# Patient Record
Sex: Male | Born: 1970 | Race: White | Hispanic: No | Marital: Married | Smoking: Never smoker
Health system: Southern US, Community
[De-identification: ages and names within clinical notes are randomized; demographics above are authoritative.]

## PROBLEM LIST (undated history)

## (undated) DIAGNOSIS — N4400 Torsion of testis, unspecified: Secondary | ICD-10-CM

## (undated) DIAGNOSIS — K219 Gastro-esophageal reflux disease without esophagitis: Secondary | ICD-10-CM

## (undated) DIAGNOSIS — K429 Umbilical hernia without obstruction or gangrene: Secondary | ICD-10-CM

## (undated) HISTORY — PX: ORCHIECTOMY: SHX2116

## (undated) HISTORY — DX: Gastro-esophageal reflux disease without esophagitis: K21.9

---

## 2011-05-31 HISTORY — PX: COLONOSCOPY: SHX174

## 2011-05-31 HISTORY — PX: ESOPHAGOGASTRODUODENOSCOPY: SHX1529

## 2012-07-07 ENCOUNTER — Emergency Department (HOSPITAL_BASED_OUTPATIENT_CLINIC_OR_DEPARTMENT_OTHER)
Admission: EM | Admit: 2012-07-07 | Discharge: 2012-07-07 | Disposition: A | Discharge status: Home / Self Care | Payer: BC Managed Care – PPO | Attending: Emergency Medicine | Admitting: Emergency Medicine

## 2012-07-07 ENCOUNTER — Emergency Department (HOSPITAL_BASED_OUTPATIENT_CLINIC_OR_DEPARTMENT_OTHER): Payer: BC Managed Care – PPO

## 2012-07-07 ENCOUNTER — Encounter (HOSPITAL_BASED_OUTPATIENT_CLINIC_OR_DEPARTMENT_OTHER): Payer: Self-pay | Admitting: *Deleted

## 2012-07-07 DIAGNOSIS — N50812 Left testicular pain: Secondary | ICD-10-CM

## 2012-07-07 DIAGNOSIS — N509 Disorder of male genital organs, unspecified: Secondary | ICD-10-CM | POA: Insufficient documentation

## 2012-07-07 HISTORY — DX: Torsion of testis, unspecified: N44.00

## 2012-07-07 NOTE — ED Notes (Signed)
Pt c/o left testicle pain with hx of torsion.

## 2012-07-08 NOTE — ED Provider Notes (Signed)
History     CSN: [id number]  Arrival date & time 07/07/12  1954   First MD Initiated Contact with Patient 07/07/12 2101      Chief Complaint  Patient presents with  . 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  Patient had spontaneous resolution while at triage with no pain medications but though he should still be checked out.  Patient is currently pain free.  He denies any urinary symptoms though he does have a history of kidney stones and he reports that this was completely different.  Patient also denies abdominal pain, nausea, vomiting, constipation, diarrhea, fever, or other concerning symptoms. Past Medical History  Diagnosis Date  . Testicular torsion     Past Surgical History  Procedure Date  . Orchiectomy     History reviewed. No pertinent family history.  History  Substance Use Topics  . Smoking status: Never Smoker   . Smokeless tobacco: Not on file  . Alcohol Use: No      Review of Systems  Constitutional: Negative.   HENT: Negative.   Eyes: Negative.   Respiratory: Negative.   Cardiovascular: Negative.   Gastrointestinal: Negative.   Genitourinary: Positive for testicular pain.  Musculoskeletal: Negative.   Skin: Negative.   Neurological: Negative.   Hematological: Negative.   Psychiatric/Behavioral: Negative.   All other systems reviewed and are negative.    Allergies  Review of patient's allergies indicates no known allergies.  Home Medications   Current Outpatient Rx  Name Route Sig Dispense Refill  . ACETAMINOPHEN 500 MG PO TABS Oral Take 1,000 mg by mouth every 6 (six) hours as needed. For pain.    Marland Kitchen ESOMEPRAZOLE MAGNESIUM  20 MG PO CPDR Oral Take 20 mg by mouth daily before breakfast.      BP 141/90  Pulse 102  Temp 98.6 F (37 C) (Oral)  Resp 16  Ht 6' (1.829 m)  Wt 215 lb (97.523 kg)  BMI 29.16 kg/m2  SpO2 100%  Physical Exam  Nursing note and vitals reviewed. GEN: Well-developed, well-nourished male in no distress HEENT: Atraumatic, normocephalic.  EYES: PERRLA BL, no scleral icterus. NECK: Trachea midline, no meningismus CV: regular rate and rhythm.  PULM: No respiratory distress.   GI: soft, non-tender. No guarding, rebound, or tenderness. + bowel sounds  GU: circumcised male with no inguinal lymphadenopathy, slight left testicular TTP, no masses noted, no other abnormalities noted Neuro: cranial nerves grossly 2-12 intact, no abnormalities of strength or sensation, A and O x 3 MSK: Patient moves all 4 extremities symmetrically, no deformity, edema, or injury noted Skin: No rashes petechiae, purpura, or jaundice Psych: no abnormality of mood   ED Course  Procedures (including critical care time)  Labs Reviewed - No data to display US Scrotum  07/07/2012  *RADIOLOGY REPORT*  Clinical Data:  Left testicular pain, history of prior torsion status post orchiopexy  SCROTAL ULTRASOUND DOPPLER ULTRASOUND OF THE TESTICLES  Technique: Complete ultrasound examination of the testicles, epididymis, and other scrotal structures was performed.  Color and spectral Doppler ultrasound were also utilized to evaluate blood flow to the testicles.  Comparison:  None.  Findings:  Right testis:  Normal homogeneous echotexture.  Right testicle measures 3.5 x 2.2 x 2.5 cm.  Normal color Doppler flow with arterial and venous waveforms detected.  No focal mass or abnormality.  Left testis:  Normal homogeneous echotexture.  No focal abnormality.  Left testicle measures 4.3 x 2.0 x 2.8 cm.  Normal color Doppler flow and arterial and venous waveforms detected.  Right epididymis:  Normal in size and appearance.  Left epididymis:   Normal in size and appearance.  Hydrocele:  Absent  Varicocele:   absent  Pulsed Doppler interrogation of both testes demonstrates low resistance flow bilaterally.  IMPRESSION: No acute finding by scrotal ultrasound.  Negative exam.  Original Report Authenticated By: Judie Petit. Ruel Favors, M.D.   [place] Art/ven Flow Abd Pelv Doppler  07/07/2012  *RADIOLOGY REPORT*  Clinical Data:  Left testicular pain, history of prior torsion status post orchiopexy  SCROTAL ULTRASOUND DOPPLER ULTRASOUND OF THE TESTICLES  Technique: Complete ultrasound examination of the testicles, epididymis, and other scrotal structures was performed.  Color and spectral Doppler ultrasound were also utilized to evaluate blood flow to the testicles.  Comparison:  None.  Findings:  Right testis:  Normal homogeneous echotexture.  Right testicle measures 3.5 x 2.2 x 2.5 cm.  Normal color Doppler flow with arterial and venous waveforms detected.  No focal mass or abnormality.  Left testis:  Normal homogeneous echotexture.  No focal abnormality.  Left testicle measures 4.3 x 2.0 x 2.8 cm.  Normal color Doppler flow and arterial and venous waveforms detected.  Right epididymis:  Normal in size and appearance.  Left epididymis:  Normal in size and appearance.  Hydrocele:  Absent  Varicocele:   absent  Pulsed Doppler interrogation of both testes demonstrates low resistance flow bilaterally.  IMPRESSION: No acute finding by scrotal ultrasound.  Negative exam.  Original Report Authenticated By: Judie Petit. Ruel Favors, M.D.     1. Testicular pain, left       MDM  Patient had spontaneouse resolution of symptoms similar to prior episodes of torsion.  He was asymmptomatic aside from mild residual tenderness on my exam and [place] was unremarkable.  Patient denied urinary symptoms.  Patient is 9 years s/p orchiopexy and we discussed that it was reasonable to follow-up with a urologist as he has had 2 episodes since surgery like this.  Patient was discharged in good  condition.        Cyndra Numbers, MD 07/08/12 1425

## 2013-12-01 ENCOUNTER — Encounter (HOSPITAL_BASED_OUTPATIENT_CLINIC_OR_DEPARTMENT_OTHER): Payer: Self-pay | Admitting: Emergency Medicine

## 2013-12-01 ENCOUNTER — Emergency Department (HOSPITAL_BASED_OUTPATIENT_CLINIC_OR_DEPARTMENT_OTHER)
Admission: EM | Admit: 2013-12-01 | Discharge: 2013-12-01 | Disposition: A | Discharge status: Home / Self Care | Payer: BC Managed Care – PPO | Attending: Emergency Medicine | Admitting: Emergency Medicine

## 2013-12-01 ENCOUNTER — Emergency Department (HOSPITAL_BASED_OUTPATIENT_CLINIC_OR_DEPARTMENT_OTHER): Payer: BC Managed Care – PPO

## 2013-12-01 DIAGNOSIS — R10A1 Flank pain, right side: Secondary | ICD-10-CM

## 2013-12-01 DIAGNOSIS — N201 Calculus of ureter: Secondary | ICD-10-CM

## 2013-12-01 DIAGNOSIS — Z79899 Other long term (current) drug therapy: Secondary | ICD-10-CM | POA: Insufficient documentation

## 2013-12-01 DIAGNOSIS — R109 Unspecified abdominal pain: Secondary | ICD-10-CM

## 2013-12-01 LAB — URINALYSIS, ROUTINE W REFLEX MICROSCOPIC
Glucose, UA: NEGATIVE mg/dL
Specific Gravity, Urine: 1.029 (ref 1.005–1.030)
Urobilinogen, UA: 0.2 mg/dL (ref 0.0–1.0)
pH: 5 (ref 5.0–8.0)

## 2013-12-01 LAB — CBC WITH DIFFERENTIAL/PLATELET
Basophils Absolute: 0 10*3/uL (ref 0.0–0.1)
Basophils Relative: 1 % (ref 0–1)
Eosinophils Relative: 3 % (ref 0–5)
HCT: 45.8 % (ref 39.0–52.0)
MCHC: 33.4 g/dL (ref 30.0–36.0)
Monocytes Absolute: 0.5 10*3/uL (ref 0.1–1.0)
Neutro Abs: 4 10*3/uL (ref 1.7–7.7)
Platelets: 213 10*3/uL (ref 150–400)
RDW: 12.9 % (ref 11.5–15.5)

## 2013-12-01 LAB — URINE MICROSCOPIC-ADD ON

## 2013-12-01 LAB — BASIC METABOLIC PANEL
Calcium: 9.3 mg/dL (ref 8.4–10.5)
Creatinine, Ser: 1.1 mg/dL (ref 0.50–1.35)
GFR calc Af Amer: >90 mL/min (ref >90)

## 2013-12-01 MED ORDER — KETOROLAC TROMETHAMINE 30 MG/ML IJ SOLN
30.0000 mg | Freq: Once | INTRAMUSCULAR | Status: AC
  Administered 2013-12-01: 30 mg via INTRAVENOUS
  Filled 2013-12-01: qty 1

## 2013-12-01 MED ORDER — ONDANSETRON HCL 4 MG/2ML IJ SOLN
4.0000 mg | Freq: Once | INTRAMUSCULAR | Status: AC
  Administered 2013-12-01: 4 mg via INTRAVENOUS
  Filled 2013-12-01: qty 2

## 2013-12-01 MED ORDER — SODIUM CHLORIDE 0.9 % IV BOLUS (SEPSIS)
1000.0000 mL | Freq: Once | INTRAVENOUS | Status: AC
  Administered 2013-12-01: 1000 mL via INTRAVENOUS

## 2013-12-01 MED ORDER — HYDROMORPHONE HCL PF 1 MG/ML IJ SOLN
1.0000 mg | Freq: Once | INTRAMUSCULAR | Status: AC
  Administered 2013-12-01: 1 mg via INTRAVENOUS
  Filled 2013-12-01: qty 1

## 2013-12-01 MED ORDER — TAMSULOSIN HCL 0.4 MG PO CAPS: 0.4000 mg | ORAL_CAPSULE | Freq: Every day | ORAL | Status: DC

## 2013-12-01 MED ORDER — HYDROCODONE-ACETAMINOPHEN 5-325 MG PO TABS: 1.0000 | ORAL_TABLET | Freq: Four times a day (QID) | ORAL | Status: DC | PRN

## 2013-12-01 NOTE — ED Notes (Signed)
Pt states he did have a near syncopal episode r/t to intense pain-wife states "he's real good at passing out"

## 2013-12-01 NOTE — ED Notes (Signed)
MD at bedside. 

## 2013-12-01 NOTE — ED Notes (Signed)
Pt c/o flank pain x 2 days hx kidney stones

## 2013-12-01 NOTE — ED Provider Notes (Addendum)
CSN: [id number]     Arrival date & time 12/01/13  1328 History   First MD Initiated Contact with Patient 12/01/13 1428     Chief Complaint  Patient presents with  . Flank Pain   (Consider location/radiation/quality/duration/timing/severity/associated sxs/prior Treatment) Patient is a 42 y.o. male presenting with flank pain. The history is provided by the patient and the spouse. No language interpreter was used.  Flank Pain This is a recurrent problem. The current episode started 2 days ago. Episode frequency: intermittently. The problem has been gradually worsening. Associated symptoms include abdominal pain. Pertinent negatives include no chest pain, no headaches and no shortness of breath. Nothing aggravates the symptoms. Nothing relieves the symptoms. He has tried nothing for the symptoms. The treatment provided no relief.    Past Medical History  Diagnosis Date  . Testicular torsion    Past Surgical History  Procedure Laterality Date  . Orchiectomy     History reviewed. No pertinent family history. History  Substance Use Topics  . Smoking status: Never Smoker   . Smokeless tobacco: Not on file  . Alcohol Use: No    Review of Systems  Constitutional: Negative for fever, activity change, appetite change and fatigue.  HENT: Negative for congestion, facial swelling, rhinorrhea and trouble swallowing.   Eyes: Negative for photophobia and pain.  Respiratory: Negative for cough, chest tightness and shortness of breath.   Cardiovascular: Negative for chest pain and leg swelling.  Gastrointestinal: Positive for abdominal pain. Negative for nausea, vomiting, diarrhea and constipation.  Endocrine: Negative for polydipsia and polyuria.  Genitourinary: Positive for flank pain. Negative for dysuria, urgency, decreased urine volume and difficulty urinating.  Musculoskeletal: Negative for back pain and gait problem.  Skin: Negative for color change, rash and wound.  Allergic/Immunologic:  Negative for immunocompromised state.  Neurological: Negative for dizziness, facial asymmetry, speech difficulty, weakness, numbness and headaches.  Psychiatric/Behavioral: Negative for confusion, decreased concentration and agitation.    Allergies  Review of patient's allergies indicates no known allergies.  Home Medications   Current Outpatient Rx  Name  Route  Sig  Dispense  Refill  . acetaminophen (TYLENOL) 500 MG tablet   Oral   Take 1,000 mg by mouth every 6 (six) hours as needed. For pain.         Marland Kitchen esomeprazole (NEXIUM) 20 MG capsule   Oral   Take 20 mg by mouth daily before breakfast.         . HYDROcodone-acetaminophen (NORCO) 5-325 MG per tablet   Oral   Take 1-2 tablets by mouth every 6 (six) hours as needed.   25 tablet   0   . tamsulosin (FLOMAX) 0.4 MG CAPS capsule   Oral   Take 1 capsule (0.4 mg total) by mouth daily.   30 capsule   0    BP 124/63  Pulse 61  Temp(Src) 97.7 F (36.5 C)  Resp 16  Ht 6' (1.829 m)  Wt 220 lb (99.791 kg)  BMI 29.83 kg/m2  SpO2 99% Physical Exam  Constitutional: He is oriented to person, place, and time. He appears well-developed and well-nourished. No distress.  HENT:  Head: Normocephalic and atraumatic.  Mouth/Throat: No oropharyngeal exudate.  Eyes: Pupils are equal, round, and reactive to light.  Neck: Normal range of motion. Neck supple.  Cardiovascular: Normal rate, regular rhythm and normal heart sounds.  Exam reveals no gallop and no friction rub.   No murmur heard. Pulmonary/Chest: Effort normal and breath sounds normal. No respiratory distress.  He has no wheezes. He has no rales.  Abdominal: Soft. Bowel sounds are normal. He exhibits no distension and no mass. There is no tenderness. There is no rebound and no guarding.    Musculoskeletal: Normal range of motion. He exhibits no edema and no tenderness.  Neurological: He is alert and oriented to person, place, and time.  Skin: Skin is warm and dry.    Psychiatric: He has a normal mood and affect.    ED Course  Procedures (including critical care time) Labs Review Labs Reviewed  URINALYSIS, ROUTINE W REFLEX MICROSCOPIC - Abnormal; Notable for the following:    Color, Urine BROWN (*)    APPearance CLOUDY (*)    Hgb urine dipstick LARGE (*)    Bilirubin Urine SMALL (*)    Ketones, ur 15 (*)    Protein, ur 100 (*)    Leukocytes, UA SMALL (*)    All other components within normal limits  URINE MICROSCOPIC-ADD ON - Abnormal; Notable for the following:    Squamous Epithelial / LPF FEW (*)    Bacteria, UA MANY (*)    All other components within normal limits  BASIC METABOLIC PANEL - Abnormal; Notable for the following:    Glucose, Bld 128 (*)    GFR calc non Af Amer 82 (*)    All other components within normal limits  CBC WITH DIFFERENTIAL   Imaging Review Ct Abdomen Pelvis Wo Contrast  12/01/2013   CLINICAL DATA:  Right flank pain.  Hematuria.  EXAM: CT ABDOMEN AND PELVIS WITHOUT CONTRAST  TECHNIQUE: Multidetector CT imaging of the abdomen and pelvis was performed following the standard protocol without intravenous contrast.  COMPARISON:  None.  FINDINGS: Mild right hydronephrosis and ureterectasis is seen. A 2 mm calculus is seen in the mid pelvic portion of the right ureter. No other ureteral or renal calculi identified. No evidence of left-sided hydronephrosis.  Mild hepatic steatosis noted. Liver, gallbladder, pancreas, spleen, and adrenal glands are otherwise unremarkable in appearance on this noncontrast study. No soft tissue masses or lymphadenopathy identified. Normal appendix visualized. No evidence of inflammatory process or abnormal fluid collections. No evidence of dilated bowel loops.  IMPRESSION: Mild right hydronephrosis due to 2 mm calculus in the mid pelvic portion of the right ureter.   Electronically Signed   By: Myles Rosenthal M.D.   On: 12/01/2013 15:37    EKG Interpretation    Date/Time:  Wednesday December 01 2013  15:16:32 EST Ventricular Rate:  62 PR Interval:  160 QRS Duration: 86 QT Interval:  430 QTC Calculation: 436 R Axis:   75 Text Interpretation:  Normal sinus rhythm with sinus arrhythmia Normal ECG Confirmed by DOCHERTY  MD, MEGAN ([id number]) on 12/01/2013 4:26:17 PM            MDM   1. Right ureteral stone   2. Right flank pain    Pt is a 42 y.o. male with Pmhx as above who presents with 2 days of intermittent, worsening R flank pain with assoc, nausea and an episode of syncope when pain suddenly worsened today.  No CP, SOB, fever, d/a, palpaitions.  On PE, VSS, pt uncomfortable, but in NAD. Abdominal exam benign. Urine with likely reactive leukocytosis, but does not appear infected.  Cr nml. CT stone study w/ 2mm stone in R ureter w/ mild hydronephrosis.  Pt's pain much improved after toradol, and dilaudid x2.  Will d/c home w/ norco, flomax.  Have recommended outpt urology f/u, PO hydration. Return  precautions given for new or worsening symptoms including fever, uncontrolled pain or vomiting.  I believe syncope vasovagal due to pain. Doubt cardiogenic syncope. Pt HDS here, EKG unremarkable.     Shanna Cisco, MD 12/01/13 0981  Shanna Cisco, MD 12/01/13 [phone]

## 2013-12-01 NOTE — Discharge Instructions (Signed)
Diet for Kidney Stones °Kidney stones are small, hard masses that form inside your kidneys. They are made up of salts and minerals and often form when high levels build up in the urine. The minerals can then start to build up, crystalize, and stick together to form stones. There are several different types of kidney stones. The following types of stones may be influenced by dietary factors:  °· Calcium Oxalate Stones. An oxalate is a salt found in certain foods. Within the body, calcium can combine with oxalates to form calcium oxalate stones, which can be excreted in the urine in high amounts. This is the most common type of kidney stone. °· Calcium Phosphate Stones. These stones may occur when the pH of the urine becomes too high, or less acidic, from too much calcium being excreted in the urine. The pH is a measure of how acidic or basic a substance is. °· Uric Acid Stones. This type of stone occurs when the pH of the urine becomes too low, or very acidic, because substances called purines build up in the urine. Purines are found in animal proteins. When the urine is highly concentrated with acid, uric acid kidney stones can form.   °Other risk factors for kidney stones include genetics, environment, and being overweight. Your caregiver may ask you to follow specific diet guidelines based on the type of stone you have to lessen the chances of your body making more kidney stones.  °GENERAL GUIDELINES FOR ALL TYPES OF STONES °· Drink plenty of fluid. Drink 12 16 cups of fluid a day, drinking mainly water. This is the most important thing you can do to prevent the formation of future kidney stones. °· Maintain a healthy weight. Your caregiver or dietitian can help you determine what a healthy weight is for you. If you are overweight, weight loss may help prevent the formation of future kidney stones. °· Eat a diet adequate in animal protein. Too much animal protein can contribute to the formation of stones. Your  dietitian can help you determine how much protein you should be eating. Avoid low carbohydrate, high protein diets. °· Follow a balanced eating approach. The DASH diet, which stands for "Dietary Approaches to Stop Hypertension," is an effective meal plan for reducing stone formation. This diet is high in fruits, vegetables, dairy, and whole grains and low in animal protein. Ask your caregiver or dietitian for information about the DASH diet. °ADDITIONAL DIET GUIDELINES FOR CALCIUM STONES °Avoid foods high in salt. This includes table salt, salt seasonings, MSG, soy sauce, cured and processed meats, salted crackers and snack foods, fast food, and canned soups and foods. Ask your caregiver or dietitian for information about reducing sodium in your diet or following the low sodium diet.  °Ensure adequate calcium intake. Use the following table for calcium guidelines: °· Men 65 years old and younger  1000 mg/day. °· Men 65 years old and older  1500 mg/day. °· Women 25 41 years old  1000 mg/day. °· Women 50 years and older  1500 mg/day. °Your dietitian can help you determine if you are getting enough calcium in your diet. Foods that are high in calcium include dairy products, broccoli, cheese, yogurt, and pudding. If you need to take a calcium supplement, take it only in the form of calcium citrate.  °Avoid foods high in oxalate. Be sure that any supplements you take do not contain more than 500 mg of vitamin C. Vitamin C is converted into oxalate in the body. You do   not need to avoid fruits and vegetables high in vitamin C.  °· Grains: High-fiber or bran cereal, whole-wheat bread, grits, barley, buckwheat, amaranth, pretzels, and fruitcake. °· Vegetables: Dried beans, wax beans, dark leafy greens, eggplant, leeks, okra, parsley, rutabaga, tomato paste, watercress, zucchini, and escarole. °· Fruit: Dried apricots, red currants, figs, kiwi, and rhubarb. °· Meat and Meat Substitutes: Soybeans and foods made from soy  (soyburger, miso), dried beans, peanut butter. °· Milk: Chocolate milk mixes and soymilk. °· Fats and Oils: Nuts (peanuts, almonds, pecans, cashews, hazelnuts) and nut butters, sesame seeds, and tDahini paste. °· Condiments/Miscellaneous: Chocolate, carob, marmalade, poppy seeds, instant iced tea, and juice from high-oxalate fruits.    °Document Released: 03/15/2011 Document Revised: 05/19/2012 Document Reviewed: 05/04/2012 °[employer]® Patient Information ©2014 [employer]. °Kidney Stones °Kidney stones (urolithiasis) are deposits that form inside your kidneys. The intense pain is caused by the stone moving through the urinary tract. When the stone moves, the ureter goes into spasm around the stone. The stone is usually passed in the urine.  °CAUSES  °· A disorder that makes certain neck glands produce too much parathyroid hormone (primary hyperparathyroidism). °· A buildup of uric acid crystals, similar to gout in your joints. °· Narrowing (stricture) of the ureter. °· A kidney obstruction present at birth (congenital obstruction). °· Previous surgery on the kidney or ureters. °· Numerous kidney infections. °SYMPTOMS  °· Feeling sick to your stomach (nauseous). °· Throwing up (vomiting). °· Blood in the urine (hematuria). °· Pain that usually spreads (radiates) to the groin. °· Frequency or urgency of urination. °DIAGNOSIS  °· Taking a history and physical exam. °· Blood or urine tests. °· CT scan. °· Occasionally, an examination of the inside of the urinary bladder (cystoscopy) is performed. °TREATMENT  °· Observation. °· Increasing your fluid intake. °· Extracorporeal shock wave lithotripsy This is a noninvasive procedure that uses shock waves to break up kidney stones. °· Surgery may be needed if you have severe pain or persistent obstruction. There are various surgical procedures. Most of the procedures are performed with the use of small instruments. Only small incisions are needed to accommodate these  instruments, so recovery time is minimized. °The size, location, and chemical composition are all important variables that will determine the proper choice of action for you. Talk to your health care provider to better understand your situation so that you will minimize the risk of injury to yourself and your kidney.  °HOME CARE INSTRUCTIONS  °· Drink enough water and fluids to keep your urine clear or pale yellow. This will help you to pass the stone or stone fragments. °· Strain all urine through the provided strainer. Keep all particulate matter and stones for your health care provider to see. The stone causing the pain may be as small as a grain of salt. It is very important to use the strainer each and every time you pass your urine. The collection of your stone will allow your health care provider to analyze it and verify that a stone has actually passed. The stone analysis will often identify what you can do to reduce the incidence of recurrences. °· Only take over-the-counter or prescription medicines for pain, discomfort, or fever as directed by your health care provider. °· Make a follow-up appointment with your health care provider as directed. °· Get follow-up X-rays if required. The absence of pain does not always mean that the stone has passed. It may have only stopped moving. If the urine remains completely obstructed, it can   cause loss of kidney function or even complete destruction of the kidney. It is your responsibility to make sure X-rays and follow-ups are completed. Ultrasounds of the kidney can show blockages and the status of the kidney. Ultrasounds are not associated with any radiation and can be performed easily in a matter of minutes. °SEEK MEDICAL CARE IF: °· You experience pain that is progressive and unresponsive to any pain medicine you have been prescribed. °SEEK IMMEDIATE MEDICAL CARE IF:  °· Pain cannot be controlled with the prescribed medicine. °· You have a fever or shaking  chills. °· The severity or intensity of pain increases over 18 hours and is not relieved by pain medicine. °· You develop a new onset of abdominal pain. °· You feel faint or pass out. °· You are unable to urinate. °MAKE SURE YOU:  °· Understand these instructions. °· Will watch your condition. °· Will get help right away if you are not doing well or get worse. °Document Released: 11/18/2005 Document Revised: 07/21/2013 Document Reviewed: 04/21/2013 °[employer]® Patient Information ©2014 [employer]. ° ° °

## 2016-10-07 ENCOUNTER — Emergency Department (HOSPITAL_BASED_OUTPATIENT_CLINIC_OR_DEPARTMENT_OTHER)
Admission: EM | Admit: 2016-10-07 | Discharge: 2016-10-07 | Disposition: A | Discharge status: Home / Self Care | Payer: BLUE CROSS/BLUE SHIELD | Attending: Emergency Medicine | Admitting: Emergency Medicine

## 2016-10-07 ENCOUNTER — Emergency Department (HOSPITAL_BASED_OUTPATIENT_CLINIC_OR_DEPARTMENT_OTHER): Payer: BLUE CROSS/BLUE SHIELD

## 2016-10-07 ENCOUNTER — Encounter (HOSPITAL_BASED_OUTPATIENT_CLINIC_OR_DEPARTMENT_OTHER): Payer: Self-pay | Admitting: *Deleted

## 2016-10-07 DIAGNOSIS — N201 Calculus of ureter: Secondary | ICD-10-CM | POA: Insufficient documentation

## 2016-10-07 DIAGNOSIS — R109 Unspecified abdominal pain: Secondary | ICD-10-CM | POA: Diagnosis present

## 2016-10-07 HISTORY — DX: Umbilical hernia without obstruction or gangrene: K42.9

## 2016-10-07 LAB — BASIC METABOLIC PANEL
ANION GAP: 11 (ref 5–15)
BUN: 11 mg/dL (ref 6–20)
CALCIUM: 9.7 mg/dL (ref 8.9–10.3)
CO2: 23 mmol/L (ref 22–32)
Chloride: 107 mmol/L (ref 101–111)
Creatinine, Ser: 1.08 mg/dL (ref 0.61–1.24)
GFR calc Af Amer: >60 mL/min (ref >60)
Glucose, Bld: 91 mg/dL (ref 65–99)
POTASSIUM: 3.8 mmol/L (ref 3.5–5.1)
SODIUM: 141 mmol/L (ref 135–145)

## 2016-10-07 LAB — URINE MICROSCOPIC-ADD ON: BACTERIA UA: NONE SEEN

## 2016-10-07 LAB — URINALYSIS, ROUTINE W REFLEX MICROSCOPIC
Bilirubin Urine: NEGATIVE
Glucose, UA: NEGATIVE mg/dL
KETONES UR: 15 mg/dL — AB
LEUKOCYTES UA: NEGATIVE
Nitrite: NEGATIVE
PROTEIN: NEGATIVE mg/dL
Specific Gravity, Urine: 1.027 (ref 1.005–1.030)
pH: 5.5 (ref 5.0–8.0)

## 2016-10-07 MED ORDER — HYDROMORPHONE HCL 1 MG/ML IJ SOLN
1.0000 mg | Freq: Once | INTRAMUSCULAR | Status: AC
  Administered 2016-10-07: 1 mg via INTRAVENOUS
  Filled 2016-10-07: qty 1

## 2016-10-07 MED ORDER — FENTANYL CITRATE (PF) 100 MCG/2ML IJ SOLN
50.0000 ug | Freq: Once | INTRAMUSCULAR | Status: AC
  Administered 2016-10-07: 50 ug via INTRAVENOUS
  Filled 2016-10-07: qty 2

## 2016-10-07 MED ORDER — SODIUM CHLORIDE 0.9 % IV SOLN
Freq: Once | INTRAVENOUS | Status: AC
  Administered 2016-10-07: 04:00:00 via INTRAVENOUS

## 2016-10-07 MED ORDER — KETOROLAC TROMETHAMINE 15 MG/ML IJ SOLN
15.0000 mg | Freq: Once | INTRAMUSCULAR | Status: AC
  Administered 2016-10-07: 15 mg via INTRAVENOUS
  Filled 2016-10-07: qty 1

## 2016-10-07 MED ORDER — ONDANSETRON 8 MG PO TBDP: 8.0000 mg | ORAL_TABLET | Freq: Three times a day (TID) | ORAL | 0 refills | Status: DC | PRN

## 2016-10-07 MED ORDER — HYDROMORPHONE HCL 4 MG PO TABS: 4.0000 mg | ORAL_TABLET | ORAL | 0 refills | Status: DC | PRN

## 2016-10-07 MED ORDER — NAPROXEN 375 MG PO TABS: ORAL_TABLET | ORAL | 0 refills | Status: DC

## 2016-10-07 MED ORDER — ONDANSETRON HCL 4 MG/2ML IJ SOLN
4.0000 mg | Freq: Once | INTRAMUSCULAR | Status: AC
  Administered 2016-10-07: 4 mg via INTRAVENOUS
  Filled 2016-10-07: qty 2

## 2016-10-07 NOTE — ED Notes (Signed)
Dr. Molpus into room 

## 2016-10-07 NOTE — ED Provider Notes (Signed)
[facility] Provider Note: Lowella DellJ. Lane Margrete Delude, MD, FACEP  CSN: [id number] MRN: [id number] ARRIVAL: 10/07/16 at 0329 ROOM: [facility]04/[facility]04   CHIEF COMPLAINT  Flank Pain   HISTORY OF PRESENT ILLNESS  Philip Myers is a 45 y.o. male with a history of ureterolithiasis. He is here with left flank pain that began yesterday. His episode yesterday lasted about 3 hours and is described as severe and characterized as like previous kidney stone pain. The pain resolved but recurred this morning. The pain radiates to the left lower quadrant of the abdomen. It is not worse with movement or palpation. He has had some mild nausea but no vomiting. His urine did appear cloudy this morning.   Past Medical History:  Diagnosis Date  . Testicular torsion   . Umbilical hernia     Past Surgical History:  Procedure Laterality Date  . ORCHIECTOMY      History reviewed. No pertinent family history.  Social History  Substance Use Topics  . Smoking status: Never Smoker  . Smokeless tobacco: Never Used  . Alcohol use No    Prior to Admission medications   Medication Sig Start Date End Date Taking? Authorizing Provider  acetaminophen (TYLENOL) 500 MG tablet Take 1,000 mg by mouth every 6 (six) hours as needed. For pain.    Historical Provider, MD  esomeprazole (NEXIUM) 20 MG capsule Take 20 mg by mouth daily before breakfast.    Historical Provider, MD  HYDROcodone-acetaminophen (NORCO) 5-325 MG per tablet Take 1-2 tablets by mouth every 6 (six) hours as needed. 12/01/13   Toy CookeyMegan Docherty, MD  tamsulosin (FLOMAX) 0.4 MG CAPS capsule Take 1 capsule (0.4 mg total) by mouth daily. 12/01/13   Toy CookeyMegan Docherty, MD    Allergies Patient has no known allergies.   REVIEW OF SYSTEMS  Negative except as noted here or in the History of Present Illness.   PHYSICAL EXAMINATION  Initial Vital Signs Blood pressure 148/92, pulse 74, temperature 97.7 F (36.5 C), temperature source Oral, resp. rate 24, height 6'  (1.829 m), weight 222 lb (100.7 kg), SpO2 100 %.  Examination General: Well-developed, well-nourished male in no acute distress; appearance consistent with age of record HENT: normocephalic; atraumatic Eyes: pupils equal, round and reactive to light; extraocular muscles intact Neck: supple Heart: regular rate and rhythm Lungs: clear to auscultation bilaterally Abdomen: soft; nondistended; nontender; bowel sounds present GU: No CVA tenderness Extremities: No deformity; full range of motion; pulses normal Neurologic: Awake, alert and oriented; motor function intact in all extremities and symmetric; no facial droop Skin: Warm and dry Psychiatric: Appears uncomfortable   RESULTS  Summary of this visit's results, reviewed by myself:   EKG Interpretation  Date/Time:    Ventricular Rate:    PR Interval:    QRS Duration:   QT Interval:    QTC Calculation:   R Axis:     Text Interpretation:        Laboratory Studies: Results for orders placed or performed during the hospital encounter of 10/07/16 (from the past 24 hour(s))  Basic metabolic panel     Status: None   Collection Time: 10/07/16  3:55 AM  Result Value Ref Range   Sodium 141 135 - 145 mmol/L   Potassium 3.8 3.5 - 5.1 mmol/L   Chloride 107 101 - 111 mmol/L   CO2 23 22 - 32 mmol/L   Glucose, Bld 91 65 - 99 mg/dL   BUN 11 6 - 20 mg/dL   Creatinine, Ser 7.821.08 0.61 -  1.24 mg/dL   Calcium 9.7 8.9 - 16.110.3 mg/dL   GFR calc non Af Amer >60 >60 mL/min   GFR calc Af Amer >60 >60 mL/min   Anion gap 11 5 - 15  Urinalysis, Routine w reflex microscopic (not [facility])     Status: Abnormal   Collection Time: 10/07/16  5:35 AM  Result Value Ref Range   Color, Urine AMBER (A) YELLOW   APPearance CLOUDY (A) CLEAR   Specific Gravity, Urine 1.027 1.005 - 1.030   pH 5.5 5.0 - 8.0   Glucose, UA NEGATIVE NEGATIVE mg/dL   Hgb urine dipstick LARGE (A) NEGATIVE   Bilirubin Urine NEGATIVE NEGATIVE   Ketones, ur 15 (A) NEGATIVE mg/dL    Protein, ur NEGATIVE NEGATIVE mg/dL   Nitrite NEGATIVE NEGATIVE   Leukocytes, UA NEGATIVE NEGATIVE  Urine microscopic-add on     Status: Abnormal   Collection Time: 10/07/16  5:35 AM  Result Value Ref Range   Squamous Epithelial / LPF 0-5 (A) NONE SEEN   WBC, UA 0-5 0 - 5 WBC/hpf   RBC / HPF TOO NUMEROUS TO COUNT 0 - 5 RBC/hpf   Bacteria, UA NONE SEEN NONE SEEN   Urine-Other MUCOUS PRESENT    Imaging Studies: Ct Renal Stone Study  Result Date: 10/07/2016 CLINICAL DATA:  Flank pain.  History of kidney stones. EXAM: CT ABDOMEN AND PELVIS WITHOUT CONTRAST TECHNIQUE: Multidetector CT imaging of the abdomen and pelvis was performed following the standard protocol without IV contrast. COMPARISON:  CT AP 12/01/2013 FINDINGS: Lower chest: No pulmonary nodules or pleural effusion. No visible pericardial effusion. Hepatobiliary: Normal noncontrast appearance of the liver. No visible biliary dilatation. Normal gallbladder. Pancreas: Normal noncontrast appearance of the pancreas. No peripancreatic fluid collection. Spleen: Normal. Adrenal glands: Normal. Urinary Tract: --Right kidney: No hydronephrosis or perinephric stranding. No nephrolithiasis. No obstructing ureteral stones. --Left kidney: Mild left hydronephrosis. There is mild dilatation of the left ureter. There is a 2 mm stone at the left ureterovesical junction (axial image 78). There is minimal left perinephric stranding. --Urinary bladder: As above, left ureterovesical junction 2 mm stone. Stomach/Bowel: No dilated loops of bowel. No evidence of colonic or enteric inflammation. No fluid collection within the abdomen. Vascular/Lymphatic: No abdominal aortic aneurysm or atherosclerotic calcification. No abdominal or pelvic lymphadenopathy. Reproductive: Normal prostate and seminal vesicles. Musculoskeletal. No focal osseous lesion. Normal visualized extraperitoneal and extrathoracic soft tissues. Fat- containing small umbilical hernia. IMPRESSION: 1.  Mild left hydronephrosis and hydroureter with 2 mm calculus at the left ureterovesical junction. 2. No other nephrolithiasis. Electronically Signed   By: Deatra RobinsonKevin  Herman M.D.   On: 10/07/2016 04:49    ED COURSE  Nursing notes and initial vitals signs, including pulse oximetry, reviewed.  Vitals:   10/07/16 0337 10/07/16 0339 10/07/16 0445 10/07/16 0500  BP: 148/92  134/73 122/78  Pulse: 74  67 70  Resp: 24     Temp: 97.7 F (36.5 C)     TempSrc: Oral     SpO2: 100%  98% 91%  Weight:  222 lb (100.7 kg)    Height:  6' (1.829 m)     6:02 AM Pain well controlled at this time. [place] [facility] with whom he can follow-up should the stone not pass spontaneously.  PROCEDURES    ED DIAGNOSES     ICD-9-CM ICD-10-CM   1. Ureterolithiasis 592.1 N20.1        Paula LibraJohn Kajol Crispen, MD 10/07/16 [phone]

## 2016-10-07 NOTE — ED Notes (Signed)
[facility] to CT, given ice chip.

## 2016-10-07 NOTE — ED Notes (Signed)
Pt aware of need for urine, IVF infusing, "not able at this time".

## 2016-10-07 NOTE — ED Notes (Signed)
[facility], pain increasing.

## 2016-10-07 NOTE — ED Notes (Signed)
C/o kidney stone pain, ongoing intermitantly for 3 weeks, h/o similar, h/o 5 kidney stones, last ~ 2 years ago, pt of [facility], pain fluctuates, onset of this episode yesterday, worse yesterday, abated and returned this am, reports "no obvious blood". seen by PCP, reportedly has an umbilical hernia. No meds PTA.

## 2016-10-07 NOTE — ED Notes (Signed)
Continues to writhe in pain, remains restless and moaning.

## 2017-03-12 ENCOUNTER — Encounter (INDEPENDENT_AMBULATORY_CARE_PROVIDER_SITE_OTHER): Payer: BLUE CROSS/BLUE SHIELD | Admitting: Ophthalmology

## 2017-03-12 DIAGNOSIS — H353131 Nonexudative age-related macular degeneration, bilateral, early dry stage: Secondary | ICD-10-CM

## 2017-03-12 DIAGNOSIS — H43813 Vitreous degeneration, bilateral: Secondary | ICD-10-CM | POA: Diagnosis not present

## 2017-03-12 DIAGNOSIS — H3562 Retinal hemorrhage, left eye: Secondary | ICD-10-CM

## 2018-03-12 ENCOUNTER — Ambulatory Visit (INDEPENDENT_AMBULATORY_CARE_PROVIDER_SITE_OTHER): Payer: BLUE CROSS/BLUE SHIELD | Admitting: Ophthalmology

## 2019-11-03 ENCOUNTER — Emergency Department (HOSPITAL_BASED_OUTPATIENT_CLINIC_OR_DEPARTMENT_OTHER)
Admission: EM | Admit: 2019-11-03 | Discharge: 2019-11-03 | Disposition: A | Discharge status: Home / Self Care | Payer: 59 | Attending: Emergency Medicine | Admitting: Emergency Medicine

## 2019-11-03 ENCOUNTER — Other Ambulatory Visit: Payer: Self-pay

## 2019-11-03 ENCOUNTER — Encounter (HOSPITAL_BASED_OUTPATIENT_CLINIC_OR_DEPARTMENT_OTHER): Payer: Self-pay

## 2019-11-03 ENCOUNTER — Emergency Department (HOSPITAL_BASED_OUTPATIENT_CLINIC_OR_DEPARTMENT_OTHER): Payer: 59

## 2019-11-03 DIAGNOSIS — Z79899 Other long term (current) drug therapy: Secondary | ICD-10-CM | POA: Diagnosis not present

## 2019-11-03 DIAGNOSIS — R109 Unspecified abdominal pain: Secondary | ICD-10-CM | POA: Diagnosis not present

## 2019-11-03 DIAGNOSIS — N4889 Other specified disorders of penis: Secondary | ICD-10-CM | POA: Diagnosis not present

## 2019-11-03 LAB — CBC WITH DIFFERENTIAL/PLATELET
Abs Immature Granulocytes: 0.01 10*3/uL (ref 0.00–0.07)
Basophils Absolute: 0.1 10*3/uL (ref 0.0–0.1)
Basophils Relative: 1 %
Eosinophils Absolute: 0.3 10*3/uL (ref 0.0–0.5)
Eosinophils Relative: 4 %
HCT: 47.4 % (ref 39.0–52.0)
Hemoglobin: 15.4 g/dL (ref 13.0–17.0)
Immature Granulocytes: 0 %
Lymphocytes Relative: 30 %
Lymphs Abs: 2 10*3/uL (ref 0.7–4.0)
MCH: 30.5 pg (ref 26.0–34.0)
MCHC: 32.5 g/dL (ref 30.0–36.0)
MCV: 93.9 fL (ref 80.0–100.0)
Monocytes Absolute: 0.7 10*3/uL (ref 0.1–1.0)
Monocytes Relative: 11 %
Neutro Abs: 3.5 10*3/uL (ref 1.7–7.7)
Neutrophils Relative %: 54 %
Platelets: 237 10*3/uL (ref 150–400)
RBC: 5.05 MIL/uL (ref 4.22–5.81)
RDW: 13.1 % (ref 11.5–15.5)
WBC: 6.5 10*3/uL (ref 4.0–10.5)
nRBC: 0 % (ref 0.0–0.2)

## 2019-11-03 LAB — COMPREHENSIVE METABOLIC PANEL
ALT: 36 U/L (ref 0–44)
AST: 21 U/L (ref 15–41)
Albumin: 4 g/dL (ref 3.5–5.0)
Alkaline Phosphatase: 79 U/L (ref 38–126)
Anion gap: 8 (ref 5–15)
BUN: 14 mg/dL (ref 6–20)
CO2: 23 mmol/L (ref 22–32)
Calcium: 9.1 mg/dL (ref 8.9–10.3)
Chloride: 107 mmol/L (ref 98–111)
Creatinine, Ser: 0.96 mg/dL (ref 0.61–1.24)
GFR calc Af Amer: >60 mL/min (ref >60)
GFR calc non Af Amer: >60 mL/min (ref >60)
Glucose, Bld: 109 mg/dL — ABNORMAL HIGH (ref 70–99)
Potassium: 4.1 mmol/L (ref 3.5–5.1)
Sodium: 138 mmol/L (ref 135–145)
Total Bilirubin: 0.6 mg/dL (ref 0.3–1.2)
Total Protein: 7.3 g/dL (ref 6.5–8.1)

## 2019-11-03 LAB — URINALYSIS, ROUTINE W REFLEX MICROSCOPIC
Bilirubin Urine: NEGATIVE
Glucose, UA: NEGATIVE mg/dL
Ketones, ur: NEGATIVE mg/dL
Leukocytes,Ua: NEGATIVE
Nitrite: NEGATIVE
Protein, ur: NEGATIVE mg/dL
Specific Gravity, Urine: >1.03 — ABNORMAL HIGH (ref 1.005–1.030)
pH: 5.5 (ref 5.0–8.0)

## 2019-11-03 LAB — URINALYSIS, MICROSCOPIC (REFLEX)

## 2019-11-03 LAB — LIPASE, BLOOD: Lipase: 22 U/L (ref 11–51)

## 2019-11-03 MED ORDER — SODIUM CHLORIDE 0.9 % IV BOLUS (SEPSIS)
1000.0000 mL | Freq: Once | INTRAVENOUS | Status: AC
  Administered 2019-11-03: 04:00:00 1000 mL via INTRAVENOUS

## 2019-11-03 NOTE — ED Triage Notes (Signed)
Right sided back pain w/ painful urination; onset several weeks ago worsening tonight. Pain now relieved. Hx of kidney stones.

## 2019-11-03 NOTE — ED Provider Notes (Signed)
TIME SEEN: 3:08 AM  CHIEF COMPLAINT: Right flank pain, pain at the tip of the penis  HPI: Patient is a 48 year old male with history of testicular torsion, umbilical hernia, kidney stones who presents to the emergency department with sudden onset sharp severe pain at the tip of his penis that would last for 15 to 30 seconds at a time and then resolve within sudden onset right flank pain for 2 hours tonight.  Flank pain came on after pain at the tip of his penis started.  Flank pain has significantly improved.  Still having pain at the tip of his penis intermittently.  No testicular pain or swelling.  No fevers, nausea, vomiting, diarrhea.  Denies dysuria, hematuria or penile discharge.  He is not concerned for STDs.  States that he does not think this feels like his previous kidney stones.  No history of injury to his back.  He does report he has had bilateral back pain for several months that he has not seen a doctor for.  States that pain would get worse after eating.  Denies abdominal pain.  ROS: See HPI Constitutional: no fever  Eyes: no drainage  ENT: no runny nose   Cardiovascular:  no chest pain  Resp: no SOB  GI: no vomiting GU: no dysuria Integumentary: no rash  Allergy: no hives  Musculoskeletal: no leg swelling  Neurological: no slurred speech ROS otherwise negative  PAST MEDICAL HISTORY/PAST SURGICAL HISTORY:  Past Medical History:  Diagnosis Date  . Testicular torsion   . Umbilical hernia     MEDICATIONS:  Prior to Admission medications   Medication Sig Start Date End Date Taking? Authorizing Provider  esomeprazole (NEXIUM) 20 MG capsule Take 20 mg by mouth daily before breakfast.    [provider]  HYDROmorphone (DILAUDID) 4 MG tablet Take 1 tablet (4 mg total) by mouth every 4 (four) hours as needed (for pain). 10/07/16   Molpus, Ceylon, MD  naproxen (NAPROSYN) 375 MG tablet Take one tablet twice daily until kidney stone passes. 10/07/16   Molpus, Manolito, MD   ondansetron (ZOFRAN ODT) 8 MG disintegrating tablet Take 1 tablet (8 mg total) by mouth every 8 (eight) hours as needed for nausea or vomiting. 10/07/16   Molpus, Endy, MD    ALLERGIES:  No Known Allergies  SOCIAL HISTORY:  Social History   Tobacco Use  . Smoking status: Never Smoker  . Smokeless tobacco: Never Used  Substance Use Topics  . Alcohol use: No    FAMILY HISTORY: History reviewed. No pertinent family history.  EXAM: BP (!) 163/97 (BP Location: Right Arm)   Pulse 84   Temp 97.9 F (36.6 C)   Resp 18   Ht 6' (1.829 m)   Wt 106.6 kg   SpO2 98%   BMI 31.87 kg/m  CONSTITUTIONAL: Alert and oriented and responds appropriately to questions. Well-appearing; well-nourished HEAD: Normocephalic EYES: Conjunctivae clear, pupils appear equal, EOM appear intact ENT: normal nose; moist mucous membranes NECK: Supple, normal ROM CARD: RRR; S1 and S2 appreciated; no murmurs, no clicks, no rubs, no gallops RESP: Normal chest excursion without splinting or tachypnea; breath sounds clear and equal bilaterally; no wheezes, no rhonchi, no rales, no hypoxia or respiratory distress, speaking full sentences ABD/GI: Normal bowel sounds; non-distended; soft, non-tender, no rebound, no guarding, no peritoneal signs, no hepatosplenomegaly GU:  Normal external genitalia, circumcised male, normal penile shaft, no blood or discharge at the urethral meatus, no testicular masses or tenderness on exam, no scrotal masses  or swelling, no hernias appreciated, 2+ femoral pulses bilaterally; no perineal erythema, warmth, subcutaneous air or crepitus; no high riding testicle, normal bilateral cremasteric reflex.  Chaperone present for exam. BACK:  The back appears normal, no CVA tenderness, no midline spinal tenderness or step-off or deformity, no redness or warmth, no ecchymosis or swelling, back is nontender to palpation EXT: Normal ROM in all joints; no deformity noted, no edema; no cyanosis SKIN:  Normal color for age and race; warm; no rash on exposed skin NEURO: Moves all extremities equally PSYCH: The patient's mood and manner are appropriate.   MEDICAL DECISION MAKING: Patient here with penile pain, right flank pain.  He states he thinks this is different than his kidney stones but symptoms sound classic for ureterolithiasis.  Suspect that he may have passed a stone but states he is still having some residual right flank pain currently.  He is requesting imaging today.  Unable to obtain ultrasound at this time and therefore will proceed with CT scan.  Will obtain labs, urinalysis.  His abdominal exam is currently benign.  Doubt cholecystitis, appendicitis, perforation, colitis, bowel obstruction.  His GU exam is benign.  No sign of torsion, epididymitis, testicular mass, Fournier's gangrene, scrotal cellulitis or abscess.  He states he is not concerned for sexually transmitted diseases.  Patient is married and has 1 male partner.  States pain is improved and declines any pain medication at this time.  No current nausea or vomiting.  Symptoms seem atypical for dissection.  Differential also includes UTI, pyelonephritis.  Urinalysis and urine culture pending.  ED PROGRESS: Patient's labs are unremarkable.  Urine shows small amount of blood but no other sign of infection.  Culture is pending.  His CT scan today was unremarkable.  No ureterolithiasis, hydronephrosis.  Normal-appearing appendix.  He states his pain is now completely gone.  He seems now more concerned about this bilateral back pain that he has for months that he has had worse after eating.  States he thinks it is ulcers but has never been diagnosed with ulcers.  No previous history of endoscopy.  Have recommended follow-up with his primary care physician for this.  We will also refer to gastroenterology.  Patient may benefit from endoscopy.  He is reassured by his normal work-up today.  At this time, I do not feel there is any  life-threatening condition present. I have reviewed, interpreted and discussed all results (EKG, imaging, lab, urine as appropriate) and exam findings with patient/family. I have reviewed nursing notes and appropriate previous records.  I feel the patient is safe to be discharged home without further emergent workup and can continue workup as an outpatient as needed. Discussed usual and customary return precautions. Patient/family verbalize understanding and are comfortable with this plan.  Outpatient follow-up has been provided as needed. All questions have been answered.   Philip SchaumannJohn Myers was evaluated in Emergency Department on 11/03/2019 for the symptoms described in the history of present illness. He was evaluated in the context of the global COVID-19 pandemic, which necessitated consideration that the patient might be at risk for infection with the SARS-CoV-2 virus that causes COVID-19. Institutional protocols and algorithms that pertain to the evaluation of patients at risk for COVID-19 are in a state of rapid change based on information released by regulatory bodies including the CDC and federal and state organizations. These policies and algorithms were followed during the patient's care in the ED.  Patient was seen wearing N95, face shield, gloves.  Jonas Goh, Delice Bison, DO 11/03/19 [phone]

## 2019-11-03 NOTE — ED Notes (Signed)
ED Provider at bedside. Ward MD. 

## 2019-11-03 NOTE — Discharge Instructions (Addendum)
I suspect that you have passed a kidney stone today.  Your CT scan, labs were normal.  There was a small amount of blood in your urine.  You may alternate between Tylenol 1000 mg every 6 hours as needed and ibuprofen 800 mg every 8 hours as needed at home for any residual pain.  As for the pain that you have had in your back for the past several months that is worse after eating, I recommend follow-up with your primary care physician for this.  Your primary care physician may refer you to gastroenterology for further evaluation.

## 2019-11-03 NOTE — ED Notes (Signed)
Patient transported to CT 

## 2019-11-04 ENCOUNTER — Encounter: Payer: Self-pay | Admitting: Gastroenterology

## 2019-11-04 LAB — URINE CULTURE: Culture: NO GROWTH

## 2019-12-07 ENCOUNTER — Telehealth (INDEPENDENT_AMBULATORY_CARE_PROVIDER_SITE_OTHER): Payer: 59 | Admitting: Gastroenterology

## 2019-12-07 ENCOUNTER — Other Ambulatory Visit: Payer: Self-pay

## 2019-12-07 ENCOUNTER — Encounter: Payer: Self-pay | Admitting: Gastroenterology

## 2019-12-07 VITALS — Ht 72.0 in | Wt 235.0 lb

## 2019-12-07 DIAGNOSIS — K2100 Gastro-esophageal reflux disease with esophagitis, without bleeding: Secondary | ICD-10-CM

## 2019-12-07 DIAGNOSIS — Z1212 Encounter for screening for malignant neoplasm of rectum: Secondary | ICD-10-CM

## 2019-12-07 DIAGNOSIS — R109 Unspecified abdominal pain: Secondary | ICD-10-CM | POA: Diagnosis not present

## 2019-12-07 DIAGNOSIS — K429 Umbilical hernia without obstruction or gangrene: Secondary | ICD-10-CM | POA: Diagnosis not present

## 2019-12-07 DIAGNOSIS — Z1211 Encounter for screening for malignant neoplasm of colon: Secondary | ICD-10-CM

## 2019-12-07 DIAGNOSIS — K760 Fatty (change of) liver, not elsewhere classified: Secondary | ICD-10-CM

## 2019-12-07 NOTE — Progress Notes (Signed)
Chief Complaint: Flank pain.  Referring Provider:  No ref. provider found      ASSESSMENT AND PLAN;   #1. GERD (EGD June 2012 - Gd A esophagitis, Dr. Norma Fredrickson)  #2.  Colorectal cancer screening due at age 49 (had colon June 2012- neg, Dr. Norma Fredrickson  #3.  Fatty liver with normal LFTs  #4.  Right flank pain attributed to renal stones. Neg NCCT 11/2019.  Has appointment with urology January 07, 2020  #5.  Umbilical hernia  Plan: -Continue Nexium 20mg  OTC on demand basis. -Nonpharmacologic means of reflux control was stressed. -Encouraged him to start exercising and gradually reduce weight.  Watch calorie intake.  This will certainly help with fatty liver. -He has appointment with urology. -Has asymptomatic umbilical hernia, leaning toward surgery.  When he decides to get surgery done, he can get in touch with .  We will be more than happy to refer him to surgery. -Recall colonoscopy at age 39.   HPI:    Philip Myers is a 49 y.o. male   No nausea, vomiting, heartburn, regurgitation, odynophagia or dysphagia.  No significant diarrhea or constipation.  No melena or hematochezia. No unintentional weight loss. No abdominal pain.  No history of itching, skin lesions, easy bruisability, intake of over-the-counter medications including diet pills, herbal medications, anabolic steroids or Tylenol.  There is no history of blood transfusions, IV drug use or family history of liver disease.  No jaundice dark urine or pale stools.  No history of alcohol abuse.  Had right flank pain-similar pain before attributed to kidney stones.  Seen in ED 11/02/2020  Past GI procedures: -Colonoscopy May 31, 2011 Dr. June 02, 2011 except for hemorrhoids.  Repeat at age 3. -EGD May 31, 2011 Dr. June 02, 2011 grade a esophagitis.  No hiatal hernia.  Past Medical History:  Diagnosis Date  . GERD (gastroesophageal reflux disease)   . Testicular torsion   . Umbilical hernia     Past Surgical History:    Procedure Laterality Date  . COLONOSCOPY  05/31/2011   [place]   . ESOPHAGOGASTRODUODENOSCOPY  05/31/2011    [place]  . ORCHIECTOMY      Family History  Problem Relation Age of Onset  . Colon cancer Neg Hx   . Esophageal cancer Neg Hx     Social History   Tobacco Use  . Smoking status: Never Smoker  . Smokeless tobacco: Never Used  Substance Use Topics  . Alcohol use: No  . Drug use: No    Current Outpatient Medications  Medication Sig Dispense Refill  . Multiple Vitamins-Minerals (MULTIVITAMIN GUMMIES MENS PO) Take 2 tablets by mouth daily.     No current facility-administered medications for this visit.    No Known Allergies  Review of Systems:  Constitutional: Denies fever, chills, diaphoresis, appetite change and fatigue.  HEENT: Denies photophobia, eye pain, redness, hearing loss, ear pain, congestion, sore throat, rhinorrhea, sneezing, mouth sores, neck pain, neck stiffness and tinnitus.   Respiratory: Denies SOB, DOE, cough, chest tightness,  and wheezing.   Cardiovascular: Denies chest pain, palpitations and leg swelling.  Genitourinary: Denies dysuria, urgency, frequency, hematuria, flank pain and difficulty urinating.  Musculoskeletal: Denies myalgias, back pain, joint swelling, arthralgias and gait problem.  Skin: No rash.  Neurological: Denies dizziness, seizures, syncope, weakness, light-headedness, numbness and headaches.  Hematological: Denies adenopathy. Easy bruising, personal or family bleeding history  Psychiatric/Behavioral: No anxiety or depression     Physical Exam:    Ht 6' (1.829  m)   Wt 235 lb (106.6 kg)   BMI 31.87 kg/m  Wt Readings from Last 3 Encounters:  12/07/19 235 lb (106.6 kg)  11/03/19 235 lb (106.6 kg)  10/07/16 222 lb (100.7 kg)   Constitutional:  Well-developed, in no acute distress. Psychiatric: Normal mood and affect. Behavior is normal. Not examined since it was a televisit.  Data Reviewed: I have  personally reviewed following labs and imaging studies  CBC: CBC Latest Ref Rng & Units 11/03/2019 12/01/2013  WBC 4.0 - 10.5 K/uL 6.5 5.9  Hemoglobin 13.0 - 17.0 g/dL 15.4 15.3  Hematocrit 39.0 - 52.0 % 47.4 45.8  Platelets 150 - 400 K/uL 237 213    CMP: CMP Latest Ref Rng & Units 11/03/2019 10/07/2016 12/01/2013  Glucose 70 - 99 mg/dL 109(H) 91 128(H)  BUN 6 - 20 mg/dL 14 11 13   Creatinine 0.61 - 1.24 mg/dL 0.96 1.08 1.10  Sodium 135 - 145 mmol/L 138 141 143  Potassium 3.5 - 5.1 mmol/L 4.1 3.8 4.0  Chloride 98 - 111 mmol/L 107 107 106  CO2 22 - 32 mmol/L 23 23 24   Calcium 8.9 - 10.3 mg/dL 9.1 9.7 9.3  Total Protein 6.5 - 8.1 g/dL 7.3 - -  Total Bilirubin 0.3 - 1.2 mg/dL 0.6 - -  Alkaline Phos 38 - 126 U/L 79 - -  AST 15 - 41 U/L 21 - -  ALT 0 - 44 U/L 36 - -  I connected with  Colon Flattery on 12/07/19 by a video enabled telemedicine application and verified that I am speaking with the correct person using two identifiers.   I discussed the limitations of evaluation and management by telemedicine. The patient expressed understanding and agreed to proceed.  Review of previous records/labs/time spent on call: 30 min     Carmell Austria, MD 12/07/2019, 9:05 AM  Cc: No ref. provider found

## 2019-12-07 NOTE — Patient Instructions (Signed)
If you are age 48 or older, your body mass index should be between 23-30. Your Body mass index is 31.87 kg/m. If this is out of the aforementioned range listed, please consider follow up with your Primary Care Provider.  If you are age 50 or younger, your body mass index should be between 19-25. Your Body mass index is 31.87 kg/m. If this is out of the aformentioned range listed, please consider follow up with your Primary Care Provider.   You will be due for a recall colonoscopy in 08/2022. We will send you a reminder in the mail when it gets closer to that time.   Thank you,  Dr. Lynann Bologna

## 2020-06-15 ENCOUNTER — Other Ambulatory Visit: Payer: Self-pay

## 2020-06-15 ENCOUNTER — Emergency Department (HOSPITAL_BASED_OUTPATIENT_CLINIC_OR_DEPARTMENT_OTHER)
Admission: EM | Admit: 2020-06-15 | Discharge: 2020-06-15 | Disposition: A | Discharge status: Home / Self Care | Payer: 59 | Attending: Emergency Medicine | Admitting: Emergency Medicine

## 2020-06-15 ENCOUNTER — Encounter (HOSPITAL_BASED_OUTPATIENT_CLINIC_OR_DEPARTMENT_OTHER): Payer: Self-pay

## 2020-06-15 ENCOUNTER — Emergency Department (HOSPITAL_BASED_OUTPATIENT_CLINIC_OR_DEPARTMENT_OTHER): Payer: 59

## 2020-06-15 DIAGNOSIS — F0781 Postconcussional syndrome: Secondary | ICD-10-CM | POA: Diagnosis not present

## 2020-06-15 DIAGNOSIS — R519 Headache, unspecified: Secondary | ICD-10-CM | POA: Diagnosis present

## 2020-06-15 NOTE — Discharge Instructions (Signed)
Return for worsening headache, one-sided numbness or weakness difficulty with speech or swallowing.  Please follow-up with your family doctor.  Your headaches persist they may want to send you to a concussion specialist or to a neurologist.

## 2020-06-15 NOTE — ED Triage Notes (Signed)
Pt c/o HA, nausea, dizziness started 630pm-pt states he fell/head injury on 7/12-was seen by PCP-NAD-to triage in w/c

## 2020-06-15 NOTE — ED Notes (Signed)
ED Provider at bedside. 

## 2020-06-15 NOTE — ED Provider Notes (Signed)
[facility] EMERGENCY DEPARTMENT Provider Note   CSN: [id number] Arrival date & time: 06/15/20  1942     History Chief Complaint  Patient presents with  . Headache  . Head Injury    Philip Myers is a 49 y.o. male.  49 yo M with a chief complaints of a headache.  Patient bumped his head about a week ago.  Was playing with his kids on the stairs and then slipped and fell down one stair and struck the back of his head against the wall.  He denied loss of consciousness denied confusion denied vomiting.  His headache is gotten persistently better over the course of the past few days.  It was completely gone earlier today and then when he went out to dinner on the way in the car he started to feel the headache recur.  Felt this bad is when it initially started.  No nausea or vomiting no one-sided weakness numbness or tingling.  Denied recurrent injury.  The history is provided by the patient.  Headache Pain location:  Occipital Quality:  Dull Radiates to:  Does not radiate Severity currently:  0/10 Severity at highest:  8/10 Onset quality:  Gradual Duration:  2 days Timing:  Constant Progression:  Worsening Chronicity:  New Similar to prior headaches: no   Relieved by:  Nothing Worsened by:  Nothing Ineffective treatments:  None tried Associated symptoms: no abdominal pain, no congestion, no diarrhea, no fever, no myalgias and no vomiting   Head Injury Associated symptoms: headache   Associated symptoms: no vomiting        Past Medical History:  Diagnosis Date  . GERD (gastroesophageal reflux disease)   . Testicular torsion   . Umbilical hernia     There are no problems to display for this patient.   Past Surgical History:  Procedure Laterality Date  . COLONOSCOPY  05/31/2011   [place]   . ESOPHAGOGASTRODUODENOSCOPY  05/31/2011    [place]  . ORCHIECTOMY         Family History  Problem Relation Age of Onset  . Colon cancer Neg Hx   .  Esophageal cancer Neg Hx     Social History   Tobacco Use  . Smoking status: Never Smoker  . Smokeless tobacco: Never Used  Vaping Use  . Vaping Use: Never used  Substance Use Topics  . Alcohol use: No  . Drug use: No    Home Medications Prior to Admission medications   Medication Sig Start Date End Date Taking? Authorizing Provider  Multiple Vitamins-Minerals (MULTIVITAMIN GUMMIES MENS PO) Take 2 tablets by mouth daily.    [provider]    Allergies    Patient has no known allergies.  Review of Systems   Review of Systems  Constitutional: Negative for chills and fever.  HENT: Negative for congestion and facial swelling.   Eyes: Negative for discharge and visual disturbance.  Respiratory: Negative for shortness of breath.   Cardiovascular: Negative for chest pain and palpitations.  Gastrointestinal: Negative for abdominal pain, diarrhea and vomiting.  Musculoskeletal: Negative for arthralgias and myalgias.  Skin: Negative for color change and rash.  Neurological: Positive for headaches. Negative for tremors and syncope.  Psychiatric/Behavioral: Negative for confusion and dysphoric mood.    Physical Exam Updated Vital Signs BP (!) 159/94 (BP Location: Right Arm)   Pulse 65   Temp 98.2 F (36.8 C) (Oral)   Resp 16   Ht 6' (1.829 m)   [employer]  108.9 kg   SpO2 100%   BMI 32.55 kg/m   Physical Exam Vitals and nursing note reviewed.  Constitutional:      Appearance: He is well-developed.  HENT:     Head: Normocephalic and atraumatic.  Eyes:     Pupils: Pupils are equal, round, and reactive to light.  Neck:     Vascular: No JVD.  Cardiovascular:     Rate and Rhythm: Normal rate and regular rhythm.     Heart sounds: No murmur heard.  No friction rub. No gallop.   Pulmonary:     Effort: No respiratory distress.     Breath sounds: No wheezing.  Abdominal:     General: There is no distension.     Tenderness: There is no guarding or rebound.    Musculoskeletal:        General: Normal range of motion.     Cervical back: Normal range of motion and neck supple.  Skin:    Coloration: Skin is not pale.     Findings: No rash.  Neurological:     Mental Status: He is alert and oriented to person, place, and time.     Cranial Nerves: Cranial nerves are intact.     Sensory: Sensation is intact.     Motor: Motor function is intact.     Coordination: Coordination is intact.     Gait: Gait is intact.     Comments: Benign neuro exam  Psychiatric:        Behavior: Behavior normal.     ED Results / Procedures / Treatments   Labs (all labs ordered are listed, but only abnormal results are displayed) Labs Reviewed - No data to display  EKG None  Radiology CT Head Wo Contrast  Result Date: 06/15/2020 CLINICAL DATA:  Head trauma, headache EXAM: CT HEAD WITHOUT CONTRAST TECHNIQUE: Contiguous axial images were obtained from the base of the skull through the vertex without intravenous contrast. COMPARISON:  None. FINDINGS: Brain: Normal anatomic configuration. No abnormal intra or extra-axial mass lesion or fluid collection. No abnormal mass effect or midline shift. No evidence of acute intracranial hemorrhage or infarct. Ventricular size is normal. Cerebellum unremarkable. Vascular: Unremarkable Skull: Intact Sinuses/Orbits: There is mild mucosal thickening noted within the left frontal sinus, several ethmoid air cells, and the maxillary sinuses bilaterally in keeping with changes of mild paranasal sinus disease. No air-fluid levels identified. Orbits are unremarkable. Other: Mastoid air cells and middle ear cavities are clear. IMPRESSION: No acute intracranial abnormality.  No calvarial fracture. Mild paranasal sinus disease. Electronically Signed   By: Helyn Numbers MD   On: 06/15/2020 20:06    Procedures Procedures (including critical care time)  Medications Ordered in ED Medications - No data to display  ED Course  I have reviewed  the triage vital signs and the nursing notes.  Pertinent labs & imaging results that were available during my care of the patient were reviewed by me and considered in my medical decision making (see chart for details).    MDM Rules/Calculators/A&P                          49 yo M with a chief complaints of headache.  Patient had struck his head about 5 days ago and then had reoccurrence of the severity earlier today.  No other neurologic symptoms.  CT was obtained in triage is negative.  Has benign neurologic exam for me.  Will discharge home.  9:07 PM:  I have discussed the diagnosis/risks/treatment options with the patient and believe the pt to be eligible for discharge home to follow-up with PCP. We also discussed returning to the ED immediately if new or worsening sx occur. We discussed the sx which are most concerning (e.g., sudden worsening pain, fever, inability to tolerate by mouth) that necessitate immediate return. Medications administered to the patient during their visit and any new prescriptions provided to the patient are listed below.  Medications given during this visit Medications - No data to display   The patient appears reasonably screen and/or stabilized for discharge and I doubt any other medical condition or other [facility] requiring further screening, evaluation, or treatment in the ED at this time prior to discharge.   Final Clinical Impression(s) / ED Diagnoses Final diagnoses:  Post concussive syndrome    Rx / DC Orders ED Discharge Orders    None       Melene Plan, DO 06/15/20 2107

## 2021-11-16 IMAGING — CT CT HEAD W/O CM
4 series · 16 of 47 positions shown, 18 images · non-contrast
Comparison: None.

CLINICAL DATA: Head trauma, headache

EXAM:
CT HEAD WITHOUT CONTRAST
TECHNIQUE: Contiguous axial images were obtained from the base of the skull
through the vertex without intravenous contrast.

[Series 2: head wo · axial · 0.43mm/px · z∈[+1174,+1294]mm · 7 of 32 slices shown, 9 images]
[im 4/32  brain]
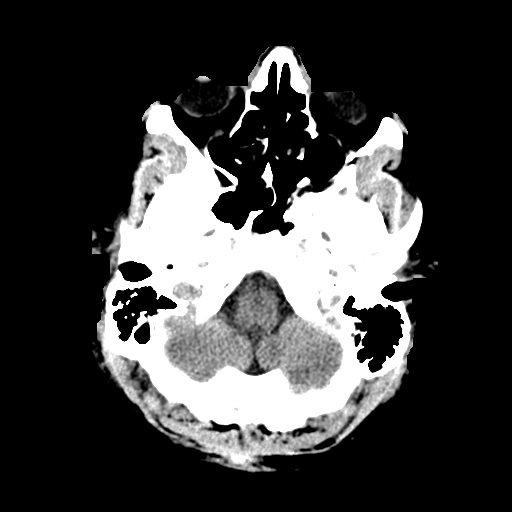
[im 4/32  bone]
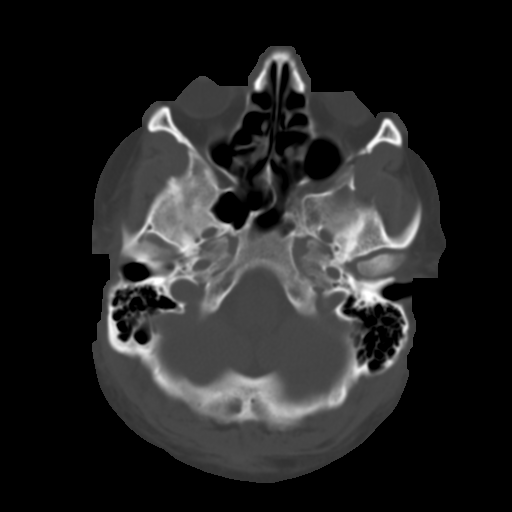
[im 8/32  brain]
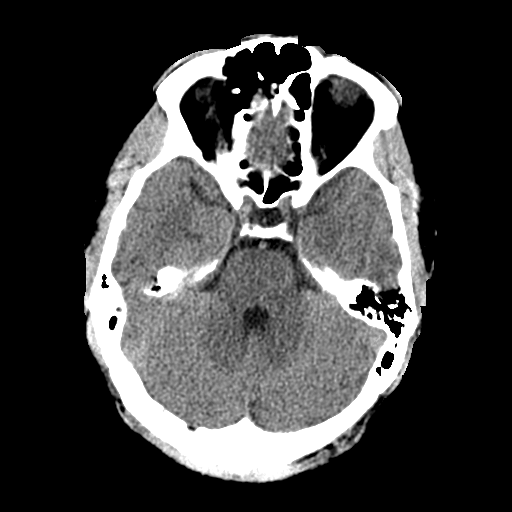
[im 12/32  brain]
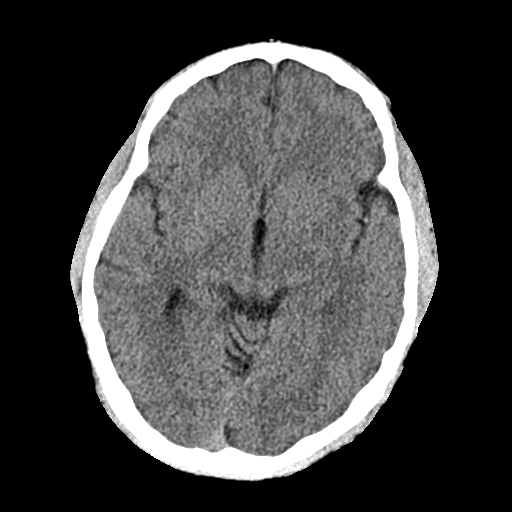
[im 16/32  brain]
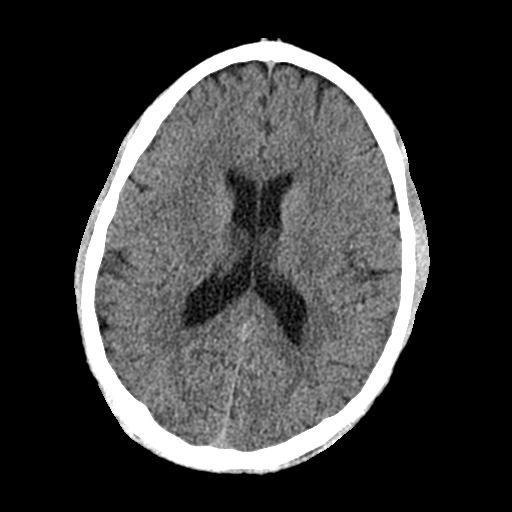
[im 20/32  brain]
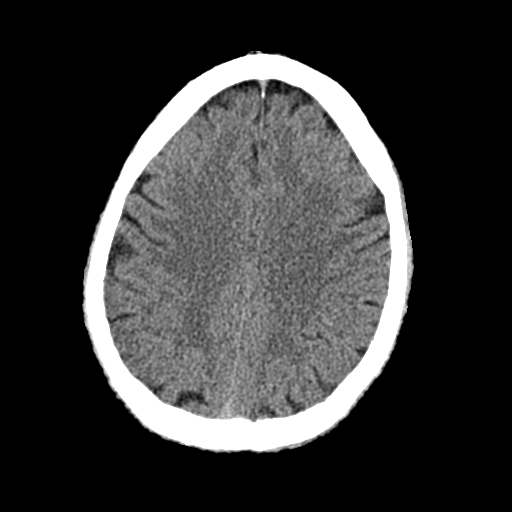
[im 20/32  bone]
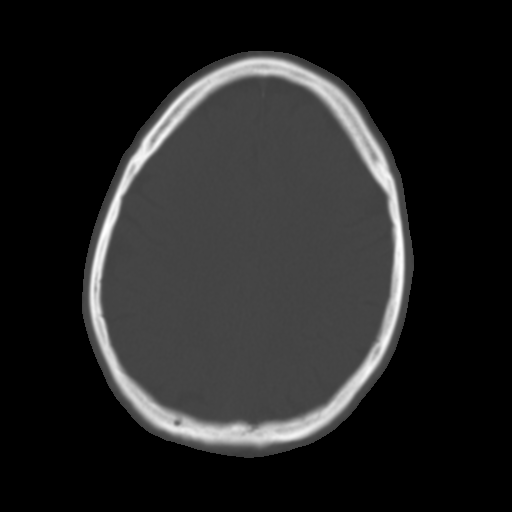
[im 24/32  brain]
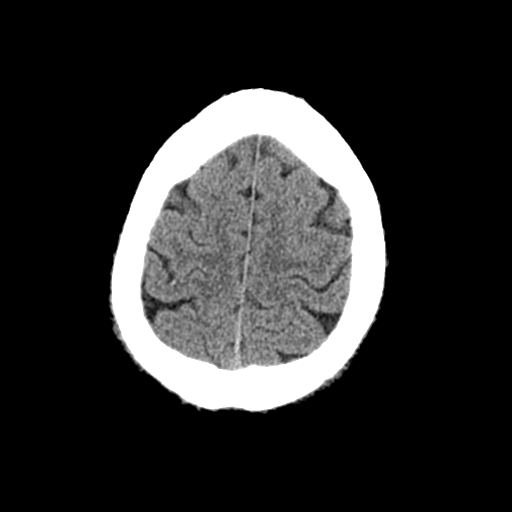
[im 28/32  brain]
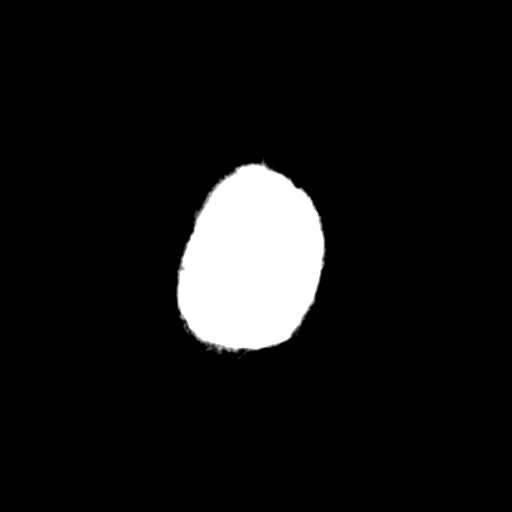

[Series 3: head bone · axial · 0.43mm/px · z∈[+1173,+1205]mm · 3 of 79 slices shown]
[im 8/79  bone]
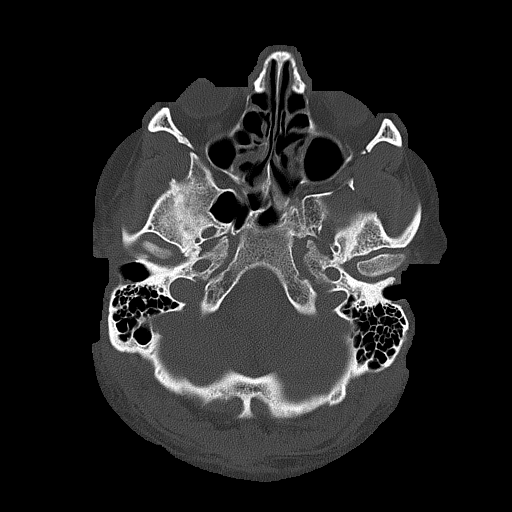
[im 16/79  bone]
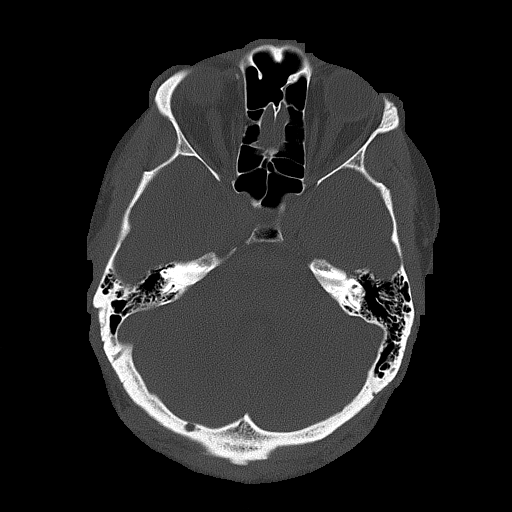
[im 24/79  bone]
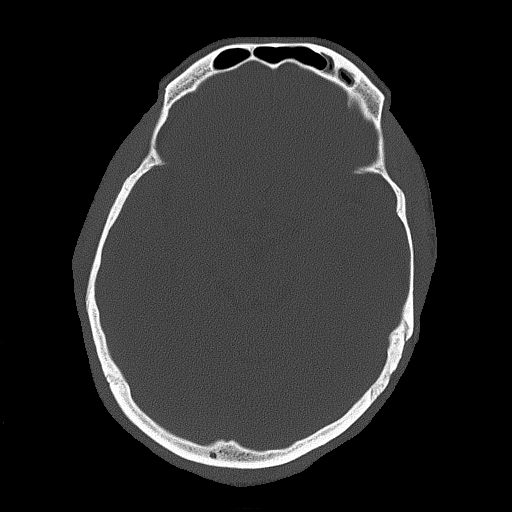

[Series 4: cor soft · coronal · 0.31mm/px · 3 of 65 slices shown]
[im 22/65  brain]
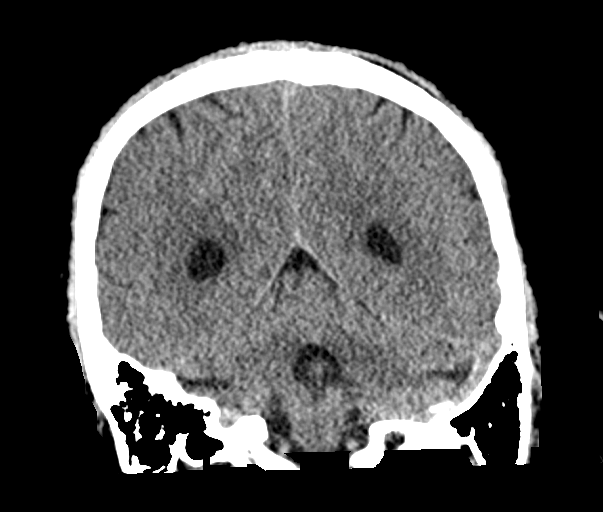
[im 29/65  brain]
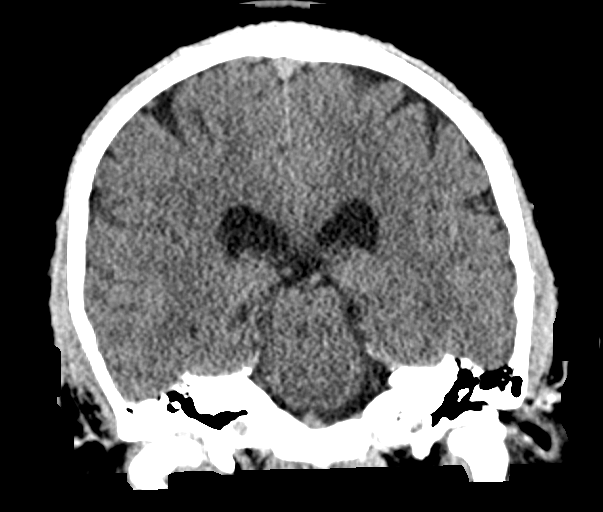
[im 36/65  brain]
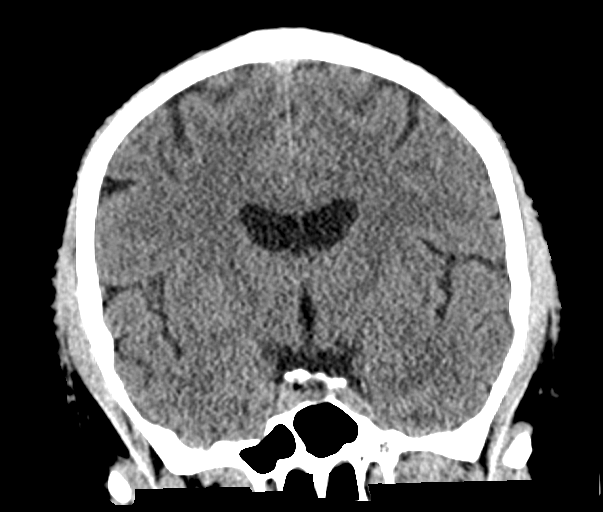

[Series 5: sag soft · sagittal · 0.31mm/px · 3 of 55 slices shown]
[im 19/55  brain]
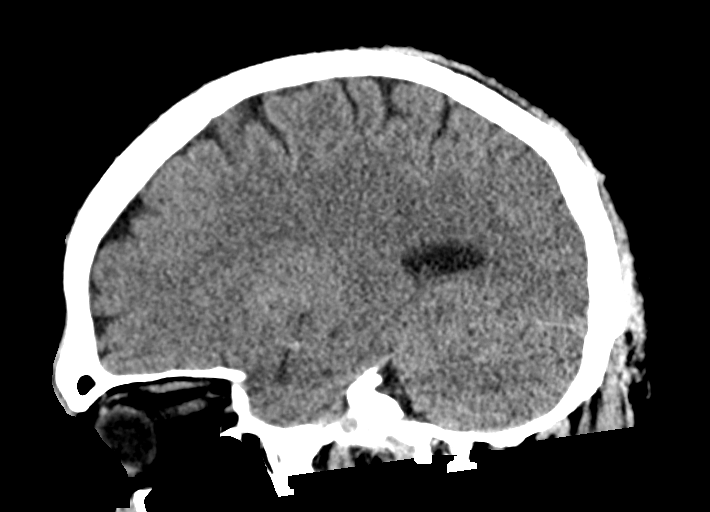
[im 28/55  brain]
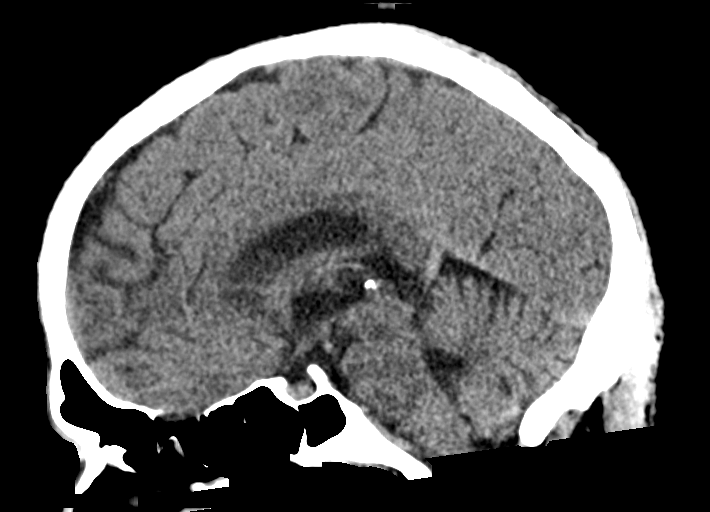
[im 37/55  brain]
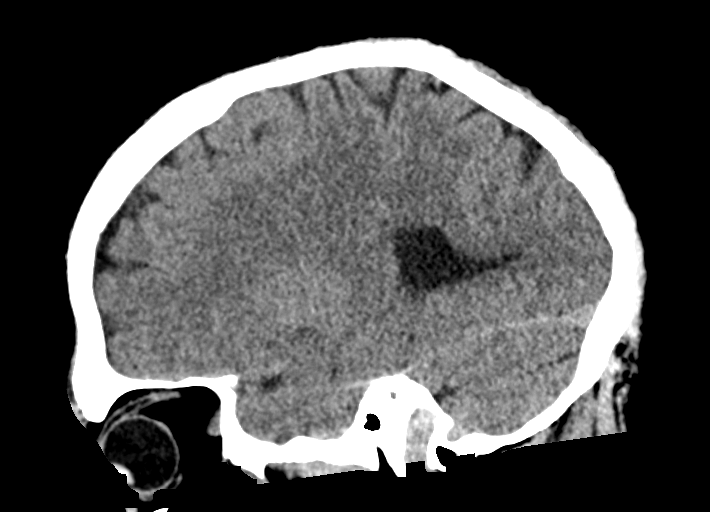

[16 of 47 positions shown; findings below may reference images not displayed]

FINDINGS: Brain: Normal anatomic configuration. No abnormal intra or
extra-axial mass lesion or fluid collection. No abnormal mass effect
or midline shift. No evidence of acute intracranial hemorrhage or
infarct. Ventricular size is normal. Cerebellum unremarkable.

Vascular: Unremarkable

Skull: Intact

Sinuses/Orbits: There is mild mucosal thickening noted within the
left frontal sinus, several ethmoid air cells, and the maxillary
sinuses bilaterally in keeping with changes of mild paranasal sinus
disease. No air-fluid levels identified. Orbits are unremarkable.

Other: Mastoid air cells and middle ear cavities are clear.
IMPRESSION: No acute intracranial abnormality.  No calvarial fracture.

Mild paranasal sinus disease.

## 2023-05-21 ENCOUNTER — Emergency Department (HOSPITAL_BASED_OUTPATIENT_CLINIC_OR_DEPARTMENT_OTHER): Payer: Managed Care, Other (non HMO)

## 2023-05-21 ENCOUNTER — Other Ambulatory Visit: Payer: Self-pay

## 2023-05-21 ENCOUNTER — Encounter (HOSPITAL_BASED_OUTPATIENT_CLINIC_OR_DEPARTMENT_OTHER): Payer: Self-pay

## 2023-05-21 ENCOUNTER — Emergency Department (HOSPITAL_BASED_OUTPATIENT_CLINIC_OR_DEPARTMENT_OTHER)
Admission: EM | Admit: 2023-05-21 | Discharge: 2023-05-21 | Disposition: A | Discharge status: Home / Self Care | Payer: Managed Care, Other (non HMO) | Attending: Emergency Medicine | Admitting: Emergency Medicine

## 2023-05-21 DIAGNOSIS — N200 Calculus of kidney: Secondary | ICD-10-CM

## 2023-05-21 DIAGNOSIS — N202 Calculus of kidney with calculus of ureter: Secondary | ICD-10-CM | POA: Insufficient documentation

## 2023-05-21 DIAGNOSIS — R109 Unspecified abdominal pain: Secondary | ICD-10-CM | POA: Diagnosis present

## 2023-05-21 LAB — CBC WITH DIFFERENTIAL/PLATELET
Abs Immature Granulocytes: 0.02 10*3/uL (ref 0.00–0.07)
Basophils Absolute: 0.1 10*3/uL (ref 0.0–0.1)
Basophils Relative: 1 %
Eosinophils Absolute: 0.3 10*3/uL (ref 0.0–0.5)
Eosinophils Relative: 4 %
HCT: 46.2 % (ref 39.0–52.0)
Hemoglobin: 15 g/dL (ref 13.0–17.0)
Immature Granulocytes: 0 %
Lymphocytes Relative: 30 %
Lymphs Abs: 2.2 10*3/uL (ref 0.7–4.0)
MCH: 28.2 pg (ref 26.0–34.0)
MCHC: 32.5 g/dL (ref 30.0–36.0)
MCV: 87 fL (ref 80.0–100.0)
Monocytes Absolute: 0.8 10*3/uL (ref 0.1–1.0)
Monocytes Relative: 11 %
Neutro Abs: 3.9 10*3/uL (ref 1.7–7.7)
Neutrophils Relative %: 54 %
Platelets: 238 10*3/uL (ref 150–400)
RBC: 5.31 MIL/uL (ref 4.22–5.81)
RDW: 14.9 % (ref 11.5–15.5)
WBC: 7.2 10*3/uL (ref 4.0–10.5)
nRBC: 0 % (ref 0.0–0.2)

## 2023-05-21 LAB — URINALYSIS, ROUTINE W REFLEX MICROSCOPIC
Bilirubin Urine: NEGATIVE
Glucose, UA: NEGATIVE mg/dL
Ketones, ur: NEGATIVE mg/dL
Leukocytes,Ua: NEGATIVE
Nitrite: NEGATIVE
Protein, ur: NEGATIVE mg/dL
Specific Gravity, Urine: >=1.03 (ref 1.005–1.030)
pH: 5.5 (ref 5.0–8.0)

## 2023-05-21 LAB — BASIC METABOLIC PANEL
Anion gap: 11 (ref 5–15)
BUN: 18 mg/dL (ref 6–20)
CO2: 23 mmol/L (ref 22–32)
Calcium: 8.4 mg/dL — ABNORMAL LOW (ref 8.9–10.3)
Chloride: 104 mmol/L (ref 98–111)
Creatinine, Ser: 0.95 mg/dL (ref 0.61–1.24)
GFR, Estimated: >60 mL/min (ref >60)
Glucose, Bld: 95 mg/dL (ref 70–99)
Potassium: 3.9 mmol/L (ref 3.5–5.1)
Sodium: 138 mmol/L (ref 135–145)

## 2023-05-21 LAB — URINALYSIS, MICROSCOPIC (REFLEX): Squamous Epithelial / HPF: NONE SEEN /HPF (ref 0–5)

## 2023-05-21 MED ORDER — KETOROLAC TROMETHAMINE 30 MG/ML IJ SOLN
30.0000 mg | Freq: Once | INTRAMUSCULAR | Status: AC
  Administered 2023-05-21: 30 mg via INTRAVENOUS
  Filled 2023-05-21: qty 1

## 2023-05-21 MED ORDER — ONDANSETRON HCL 4 MG/2ML IJ SOLN
4.0000 mg | Freq: Once | INTRAMUSCULAR | Status: AC
  Administered 2023-05-21: 4 mg via INTRAVENOUS
  Filled 2023-05-21: qty 2

## 2023-05-21 MED ORDER — SODIUM CHLORIDE 0.9 % IV BOLUS
1000.0000 mL | Freq: Once | INTRAVENOUS | Status: AC
  Administered 2023-05-21: 1000 mL via INTRAVENOUS

## 2023-05-21 MED ORDER — FENTANYL CITRATE PF 50 MCG/ML IJ SOSY
50.0000 ug | PREFILLED_SYRINGE | Freq: Once | INTRAMUSCULAR | Status: AC
  Administered 2023-05-21: 50 ug via INTRAVENOUS
  Filled 2023-05-21: qty 1

## 2023-05-21 NOTE — ED Provider Notes (Signed)
[place] EMERGENCY DEPARTMENT AT [facility]  Provider Note  CSN: [id number] Arrival date & time: 05/21/23 0416  History Chief Complaint  Patient presents with   Flank Pain    Philip Myers is a 52 y.o. male with history of prior kidney stones reports he woke up during the night with severe L flank pain, associated with nausea. No fever. Some increased frequency and urgency. No hematuria.    Home Medications Prior to Admission medications   Medication Sig Start Date End Date Taking? Authorizing Provider  Multiple Vitamins-Minerals (MULTIVITAMIN GUMMIES MENS PO) Take 2 tablets by mouth daily.    [provider]     Allergies    Patient has no known allergies.   Review of Systems   Review of Systems Please see HPI for pertinent positives and negatives  Physical Exam BP (!) 164/99 (BP Location: Right Arm)   Pulse 70   Temp 97.9 F (36.6 C) (Oral)   Resp 20   Ht 6' (1.829 m)   Wt 111.1 kg   SpO2 100%   BMI 33.23 kg/m   Physical Exam Vitals and nursing note reviewed.  HENT:     Head: Normocephalic.     Nose: Nose normal.  Eyes:     Extraocular Movements: Extraocular movements intact.  Pulmonary:     Effort: Pulmonary effort is normal.  Musculoskeletal:        General: Normal range of motion.     Cervical back: Neck supple.  Skin:    Findings: No rash (on exposed skin).  Neurological:     Mental Status: He is alert and oriented to person, place, and time.  Psychiatric:        Mood and Affect: Mood normal.     ED Results / Procedures / Treatments   EKG None  Procedures Procedures  Medications Ordered in the ED Medications  fentaNYL (SUBLIMAZE) injection 50 mcg (50 mcg Intravenous Given 05/21/23 0431)  ondansetron (ZOFRAN) injection 4 mg (4 mg Intravenous Given 05/21/23 0431)  sodium chloride 0.9 % bolus 1,000 mL (1,000 mLs Intravenous New Bag/Given 05/21/23 0434)  ketorolac (TORADOL) 30 MG/ML injection 30 mg (30 mg Intravenous Given  05/21/23 0521)    Initial Impression and Plan  Patient here with symptoms consistent with previous renal colic. No recent imaging studies. Reports he has never had to have surgery/stents for prior stones. Will check labs, CT. Pain/nausea meds for comfort.   ED Course   Clinical Course as of 05/21/23 0546  Wed May 21, 2023  0434 CBC is normal.  [CS]  0520 BMP is normal. I personally viewed the images from radiology studies and agree with radiologist interpretation:  CT shows a small stone in the bladder. Patient still reports pain. Will give a dose of Toradol. He strained his urine after the CT but no stone was caught.  [CS]  0541 UA with some blood but no infection.  [CS]  [id number] Patient reports improvement in pain. Recommend he continue straining his urine until stone is recovered. Given dietary recommendations to prevent further stones. He is aware of remaining intrarenal stone. Urology follow up, RTED for any other concerns.  [CS]    Clinical Course User Index [CS] Pollyann Savoy, MD     MDM Rules/Calculators/A&P Medical Decision Making Problems Addressed: Nephrolithiasis: acute illness or injury  Amount and/or Complexity of Data Reviewed Labs: ordered. Decision-making details documented in ED Course. Radiology: ordered and independent interpretation performed. Decision-making details documented in ED Course.  Risk Prescription drug management. Parenteral controlled substances.     Final Clinical Impression(s) / ED Diagnoses Final diagnoses:  Nephrolithiasis    Rx / DC Orders ED Discharge Orders     None        Pollyann Savoy, MD 05/21/23 [phone]

## 2023-05-21 NOTE — ED Triage Notes (Signed)
Pt bega to have acute pain in LT flank and pt thinks it is kidney stone. Reports hx of 20+ kidney stones. No hematuria per report. Increased urgency and burning with urination.

## 2024-03-18 ENCOUNTER — Encounter: Admitting: Urology

## 2024-03-25 ENCOUNTER — Encounter: Payer: Self-pay | Admitting: Urology

## 2024-03-25 ENCOUNTER — Ambulatory Visit (INDEPENDENT_AMBULATORY_CARE_PROVIDER_SITE_OTHER): Payer: Self-pay | Admitting: Urology

## 2024-03-25 VITALS — BP 136/79 | HR 85 | Ht 72.0 in | Wt 260.0 lb

## 2024-03-25 DIAGNOSIS — N200 Calculus of kidney: Secondary | ICD-10-CM | POA: Diagnosis not present

## 2024-03-25 DIAGNOSIS — R109 Unspecified abdominal pain: Secondary | ICD-10-CM | POA: Diagnosis not present

## 2024-03-25 DIAGNOSIS — R10A0 Flank pain, unspecified side: Secondary | ICD-10-CM

## 2024-03-25 LAB — URINALYSIS, ROUTINE W REFLEX MICROSCOPIC
Bilirubin, UA: NEGATIVE
Glucose, UA: NEGATIVE
Ketones, UA: NEGATIVE
Leukocytes,UA: NEGATIVE
Nitrite, UA: NEGATIVE
Protein,UA: NEGATIVE
RBC, UA: NEGATIVE
Specific Gravity, UA: >1.03 — ABNORMAL HIGH (ref 1.005–1.030)
Urobilinogen, Ur: 0.2 mg/dL (ref 0.2–1.0)
pH, UA: 5 (ref 5.0–7.5)

## 2024-03-25 NOTE — Progress Notes (Signed)
 Assessment: 1. Nephrolithiasis   2. Flank pain     Plan: I personally reviewed the patient's chart including provider notes, and imaging results. Recommend further evaluation of his right sided flank pain with a CT renal stone study. I discussed the possibility of uric acid stones given his low urine pH. Will contact him with results of the CT and arrange follow-up.  Chief Complaint:  Chief Complaint  Patient presents with   Nephrolithiasis    History of Present Illness:  Philip Myers is a 53 y.o. male who is seen for evaluation of possible kidney stone. He reports a significant history of kidney stones, passing approximately 30 stones in his lifetime.  He has not required any surgical intervention for stones.  He has not been able to capture a stone for analysis. He was seen in the emergency room in June 2024 for left sided flank pain. CT from 05/21/2023 showed a nonobstructive left renal calculus and a 3 mm calculus in the urinary bladder suggesting recently passed stone. He reports passing a stone after that episode. He presents today with a 2-51-month history of a dull pain in his right side.  This is fairly constant in nature.  It is not relieved with position or movement.  He has not had any change in his urinary symptoms.  No dysuria or gross hematuria.  No recent imaging studies.   Past Medical History:  Past Medical History:  Diagnosis Date   GERD (gastroesophageal reflux disease)    Testicular torsion    Umbilical hernia     Past Surgical History:  Past Surgical History:  Procedure Laterality Date   COLONOSCOPY  05/31/2011   [place]    ESOPHAGOGASTRODUODENOSCOPY  05/31/2011    [place]   ORCHIECTOMY      Allergies:  No Known Allergies  Family History:  Family History  Problem Relation Age of Onset   Colon cancer Neg Hx    Esophageal cancer Neg Hx     Social History:  Social History   Tobacco Use   Smoking status: Never   Smokeless tobacco:  Never  Vaping Use   Vaping status: Never Used  Substance Use Topics   Alcohol use: No   Drug use: No    Review of symptoms:  Constitutional:  Negative for unexplained weight loss, night sweats, fever, chills ENT:  Negative for nose bleeds, sinus pain, painful swallowing CV:  Negative for chest pain, shortness of breath, exercise intolerance, palpitations, loss of consciousness Resp:  Negative for cough, wheezing, shortness of breath GI:  Negative for nausea, vomiting, diarrhea, bloody stools GU:  Positives noted in HPI; otherwise negative for gross hematuria, dysuria, urinary incontinence Neuro:  Negative for seizures, poor balance, limb weakness, slurred speech Psych:  Negative for lack of energy, depression, anxiety Endocrine:  Negative for polydipsia, polyuria, symptoms of hypoglycemia (dizziness, hunger, sweating) Hematologic:  Negative for anemia, purpura, petechia, prolonged or excessive bleeding, use of anticoagulants  Allergic:  Negative for difficulty breathing or choking as a result of exposure to anything; no shellfish allergy; no allergic response (rash/itch) to materials, foods  Physical exam: BP 136/79   Pulse 85   Ht 6' (1.829 m)   Wt 260 lb (117.9 kg)   BMI 35.26 kg/m  GENERAL APPEARANCE:  Well appearing, well developed, well nourished, NAD HEENT: Atraumatic, Normocephalic, oropharynx clear. NECK: Supple without lymphadenopathy or thyromegaly. LUNGS: Clear to auscultation bilaterally. HEART: Regular Rate and Rhythm without murmurs, gallops, or rubs. ABDOMEN: Soft, non-tender,  No Masses. EXTREMITIES: Moves all extremities well.  Without clubbing, cyanosis, or edema. NEUROLOGIC:  Alert and oriented x 3, normal gait, CN II-XII grossly intact.  MENTAL STATUS:  Appropriate. BACK:  Non-tender to palpation.  No CVAT SKIN:  Warm, dry and intact.    Results: U/A:  pH 5.0, negative for blood, LE

## 2024-03-27 ENCOUNTER — Ambulatory Visit (HOSPITAL_BASED_OUTPATIENT_CLINIC_OR_DEPARTMENT_OTHER)

## 2024-03-31 ENCOUNTER — Other Ambulatory Visit: Payer: Self-pay | Admitting: Urology

## 2024-04-01 ENCOUNTER — Telehealth (HOSPITAL_BASED_OUTPATIENT_CLINIC_OR_DEPARTMENT_OTHER): Payer: Self-pay

## 2024-12-03 ENCOUNTER — Ambulatory Visit: Admitting: Urology

## 2024-12-03 ENCOUNTER — Encounter: Payer: Self-pay | Admitting: Urology

## 2024-12-03 VITALS — BP 131/81 | HR 64 | Ht 72.0 in | Wt 255.0 lb

## 2024-12-03 DIAGNOSIS — Z87442 Personal history of urinary calculi: Secondary | ICD-10-CM | POA: Diagnosis not present

## 2024-12-03 DIAGNOSIS — N200 Calculus of kidney: Secondary | ICD-10-CM | POA: Diagnosis not present

## 2024-12-03 DIAGNOSIS — R10A1 Flank pain, right side: Secondary | ICD-10-CM

## 2024-12-03 LAB — MICROSCOPIC EXAMINATION

## 2024-12-03 LAB — URINALYSIS, ROUTINE W REFLEX MICROSCOPIC
Bilirubin, UA: NEGATIVE
Glucose, UA: NEGATIVE
Leukocytes,UA: NEGATIVE
Nitrite, UA: NEGATIVE
Protein,UA: NEGATIVE
RBC, UA: NEGATIVE
Urobilinogen, Ur: 0.2 mg/dL (ref 0.2–1.0)
pH, UA: 5 (ref 5.0–7.5)

## 2024-12-03 MED ORDER — TAMSULOSIN HCL 0.4 MG PO CAPS: 0.4000 mg | ORAL_CAPSULE | Freq: Every day | ORAL | 1 refills | Status: AC

## 2024-12-03 NOTE — Progress Notes (Signed)
 "  Assessment: 1. Nephrolithiasis   2. Right flank pain     Plan: Recommend further evaluation of his right sided flank pain and nephrolithiasis with a CT renal stone study. I again discussed the possibility of uric acid stones given his low urine pH. Tamsulosin  0.4 mg daily.  Rx sent. Strain urine. He declined pain medication at this time. Will contact him with results of the CT and arrange follow-up.  Chief Complaint:  Chief Complaint  Patient presents with   Nephrolithiasis    History of Present Illness:  Philip Myers is a 54 y.o. male who is seen for further evaluation of possible kidney stone. He has a significant history of kidney stones, passing approximately 30 stones in his lifetime.  He has not required any surgical intervention for stones.  He has not been able to capture a stone for analysis. He was seen in the emergency room in June 2024 for left sided flank pain. CT from 05/21/2023 showed a nonobstructive left renal calculus and a 3 mm calculus in the urinary bladder suggesting recently passed stone. He reported passing a stone after that episode. He was seen in April 2025 with a 2-28-month history of a dull pain in his right side. His symptoms were fairly constant in nature and not relieved with position or movement.  He had not had any change in his urinary symptoms.  No dysuria or gross hematuria.  CT urogram was ordered but the patient did not proceed as he reports passing a stone shortly after that visit. He returns today with a several week history of right sided back pain.  He notes a fairly constant pain in the right lower thoracic region.  This is not relieved with position or movement.  He has not taken any pain medication for his symptoms.  No radiation of the pain.  No dysuria or gross hematuria.  He does report some slight discomfort in the scrotum.  No fevers or chills.  Portions of the above documentation were copied from a prior visit for review purposes  only.   Past Medical History:  Past Medical History:  Diagnosis Date   GERD (gastroesophageal reflux disease)    Testicular torsion    Umbilical hernia     Past Surgical History:  Past Surgical History:  Procedure Laterality Date   COLONOSCOPY  05/31/2011   [place]    ESOPHAGOGASTRODUODENOSCOPY  05/31/2011    [place]   ORCHIECTOMY      Allergies:  No Known Allergies  Family History:  Family History  Problem Relation Age of Onset   Colon cancer Neg Hx    Esophageal cancer Neg Hx     Social History:  Social History   Tobacco Use   Smoking status: Never   Smokeless tobacco: Never  Vaping Use   Vaping status: Never Used  Substance Use Topics   Alcohol use: No   Drug use: No    ROS: Constitutional:  Negative for fever, chills, weight loss CV: Negative for chest pain, previous MI, hypertension Respiratory:  Negative for shortness of breath, wheezing, sleep apnea, frequent cough GI:  Negative for nausea, vomiting, bloody stool, GERD  Physical exam: BP 131/81   Pulse 64   Ht 6' (1.829 m)   Wt 255 lb (115.7 kg)   BMI 34.58 kg/m  GENERAL APPEARANCE:  Well appearing, well developed, well nourished, NAD HEENT:  Atraumatic, normocephalic, oropharynx clear NECK:  Supple without lymphadenopathy or thyromegaly ABDOMEN:  Soft, non-tender, no masses  EXTREMITIES:  Moves all extremities well, without clubbing, cyanosis, or edema NEUROLOGIC:  Alert and oriented x 3, normal gait, CN II-XII grossly intact MENTAL STATUS:  appropriate BACK:  Non-tender to palpation, No CVAT SKIN:  Warm, dry, and intact  Results: U/A: pH 5.0, 0-5 WBCs, 0-2 RBCs "

## 2024-12-08 ENCOUNTER — Ambulatory Visit (HOSPITAL_BASED_OUTPATIENT_CLINIC_OR_DEPARTMENT_OTHER)
